# Patient Record
Sex: Female | Born: 1990 | Hispanic: Refuse to answer | Marital: Married | Smoking: Never smoker
Health system: Southern US, Community
[De-identification: ages and names within clinical notes are randomized; demographics above are authoritative.]

## PROBLEM LIST (undated history)

## (undated) DIAGNOSIS — Z3482 Encounter for supervision of other normal pregnancy, second trimester: Secondary | ICD-10-CM

## (undated) DIAGNOSIS — R509 Fever, unspecified: Secondary | ICD-10-CM

## (undated) DIAGNOSIS — Z789 Other specified health status: Secondary | ICD-10-CM

---

## 2015-06-07 NOTE — L&D Delivery Note (Signed)
Delivery Note At 1:41 AM a viable female was delivered via Vaginal, Spontaneous Delivery (Presentation:vtx; direct OA).  APGARs, weight  pending.  Infant delivered to skin to skin on mom. Infant felt warm. Spontaneous cry heard. Body cord noted. Delayed cord clamping x 1 minute. Placenta status: Spontaneous with 3-V Cord:  with the following complications: None.  Trailing membranes removed from uterus. Fundus firm, minimal bleeding  Anesthesia: Epidural, Local Episiotomy: None Lacerations:  2nd degree Suture Repair: 3.0 vicryl rapide Est. Blood Loss (mL):  400 mL  Mom to postpartum.  Baby to Couplet care / Skin to Skin.  Rainey Kahrs S 10/31/2015, 2:11 AM

## 2015-06-19 ENCOUNTER — Encounter: Payer: Self-pay | Admitting: *Deleted

## 2015-07-07 ENCOUNTER — Ambulatory Visit (INDEPENDENT_AMBULATORY_CARE_PROVIDER_SITE_OTHER): Payer: Medicaid Other | Admitting: Advanced Practice Midwife

## 2015-07-07 ENCOUNTER — Other Ambulatory Visit (HOSPITAL_COMMUNITY)
Admission: RE | Admit: 2015-07-07 | Discharge: 2015-07-07 | Disposition: A | Discharge status: Home / Self Care | Payer: Medicaid Other | Source: Ambulatory Visit | Attending: Advanced Practice Midwife | Admitting: Advanced Practice Midwife

## 2015-07-07 ENCOUNTER — Encounter: Payer: Self-pay | Admitting: Advanced Practice Midwife

## 2015-07-07 ENCOUNTER — Encounter: Payer: Self-pay | Admitting: Obstetrics & Gynecology

## 2015-07-07 VITALS — BP 135/76 | HR 98 | Temp 98.2°F | Ht 63.0 in | Wt 159.3 lb

## 2015-07-07 DIAGNOSIS — Z23 Encounter for immunization: Secondary | ICD-10-CM

## 2015-07-07 DIAGNOSIS — Z3402 Encounter for supervision of normal first pregnancy, second trimester: Secondary | ICD-10-CM

## 2015-07-07 DIAGNOSIS — Z3400 Encounter for supervision of normal first pregnancy, unspecified trimester: Secondary | ICD-10-CM | POA: Insufficient documentation

## 2015-07-07 DIAGNOSIS — Z01419 Encounter for gynecological examination (general) (routine) without abnormal findings: Secondary | ICD-10-CM | POA: Insufficient documentation

## 2015-07-07 DIAGNOSIS — Z113 Encounter for screening for infections with a predominantly sexual mode of transmission: Secondary | ICD-10-CM | POA: Insufficient documentation

## 2015-07-07 DIAGNOSIS — Z124 Encounter for screening for malignant neoplasm of cervix: Secondary | ICD-10-CM

## 2015-07-07 LAB — POCT URINALYSIS DIP (DEVICE)
BILIRUBIN URINE: NEGATIVE
GLUCOSE, UA: NEGATIVE mg/dL
KETONES UR: NEGATIVE mg/dL
Nitrite: NEGATIVE
PROTEIN: NEGATIVE mg/dL
SPECIFIC GRAVITY, URINE: 1.02 (ref 1.005–1.030)
Urobilinogen, UA: 0.2 mg/dL (ref 0.0–1.0)
pH: 6 (ref 5.0–8.0)

## 2015-07-07 NOTE — Progress Notes (Signed)
Anatomy US scheduled for February 6th @ 1500

## 2015-07-07 NOTE — Progress Notes (Signed)
Subjective:  Rebecca Ryan Rebecca Ryan is a 25 y.o. G1P0 at [redacted]w[redacted]d being seen today for ongoing prenatal care.  She is currently monitored for the following issues for this low-risk pregnancy and  does not have a problem list on file.  Patient reports no complaints.   . Vag. Bleeding: None.  Movement: Present. Denies leaking of fluid.   The following portions of the patient's history were reviewed and updated as appropriate: allergies, current medications, past family history, past medical history, past social history, past surgical history and problem list. Problem list updated.  Objective:   Filed Vitals:   07/07/15 1320 07/07/15 1321  BP: 135/76   Pulse: 98   Temp: 98.2 F (36.8 C)   Height:  5\' 3"  (1.6 m)  Weight: 159 lb 4.8 oz (72.258 kg)     Fetal Status: Fetal Heart Rate (bpm): 145   Movement: Present     General:  Alert, oriented and cooperative. Patient is in no acute distress.  Skin: Skin is warm and dry. No rash noted.   Cardiovascular: Normal heart rate noted  Respiratory: Normal respiratory effort, no problems with respiration noted  Abdomen: Soft, gravid, appropriate for gestational age. Pain/Pressure: Present     Pelvic: Vag. Bleeding: None     Cervical exam deferred        Extremities: Normal range of motion.  Edema: None  Mental Status: Normal mood and affect. Normal behavior. Normal judgment and thought content.   Urinalysis: Urine Protein: Negative Urine Glucose: Negative  Assessment and Plan:  Pregnancy: G1P0 at [redacted]w[redacted]d  1. Encounter for supervision of normal first pregnancy in second trimester  - Prenatal Profile - Prescript Monitor Profile(19) - Hemoglobinopathy evaluation - GC/Chlamydia probe amp ([place])not at [facility] - Cytology - PAP - Culture, OB Urine - [place] MFM OB COMP + 14 WK; Future  2. Needs flu shot   Preterm labor symptoms and general obstetric precautions including but not limited to vaginal bleeding, contractions, leaking of fluid and fetal  movement were reviewed in detail with the patient. Please refer to After Visit Summary for other counseling recommendations.  Return in about 4 weeks (around 08/04/2015).   Elvera Maria, CNM

## 2015-07-08 LAB — PRENATAL PROFILE (SOLSTAS)
Antibody Screen: NEGATIVE
BASOS ABS: 0 10*3/uL (ref 0.0–0.1)
Basophils Relative: 0 % (ref 0–1)
EOS PCT: 1 % (ref 0–5)
Eosinophils Absolute: 0.1 10*3/uL (ref 0.0–0.7)
HEMATOCRIT: 36.3 % (ref 36.0–46.0)
HEP B S AG: NEGATIVE
HIV 1&2 Ab, 4th Generation: NONREACTIVE
Hemoglobin: 12.6 g/dL (ref 12.0–15.0)
LYMPHS ABS: 1.5 10*3/uL (ref 0.7–4.0)
LYMPHS PCT: 11 % — AB (ref 12–46)
MCH: 32.6 pg (ref 26.0–34.0)
MCHC: 34.7 g/dL (ref 30.0–36.0)
MCV: 93.8 fL (ref 78.0–100.0)
MPV: 10.1 fL (ref 8.6–12.4)
Monocytes Absolute: 0.7 10*3/uL (ref 0.1–1.0)
Monocytes Relative: 5 % (ref 3–12)
NEUTROS ABS: 11.1 10*3/uL — AB (ref 1.7–7.7)
Neutrophils Relative %: 83 % — ABNORMAL HIGH (ref 43–77)
PLATELETS: 196 10*3/uL (ref 150–400)
RBC: 3.87 MIL/uL (ref 3.87–5.11)
RDW: 14.8 % (ref 11.5–15.5)
RH TYPE: POSITIVE
Rubella: 4.94 Index — ABNORMAL HIGH (ref <0.90)
WBC: 13.4 10*3/uL — AB (ref 4.0–10.5)

## 2015-07-08 LAB — HEMOGLOBINOPATHY EVALUATION
HGB A: 97.2 % (ref 96.8–97.8)
HGB S QUANTITAION: 0 %
Hemoglobin Other: 0 %
Hgb A2 Quant: 2.8 % (ref 2.2–3.2)
Hgb F Quant: 0 % (ref 0.0–2.0)

## 2015-07-08 LAB — PRESCRIPTION MONITORING PROFILE (19 PANEL)
Amphetamine/Meth: NEGATIVE ng/mL
Barbiturate Screen, Urine: NEGATIVE ng/mL
Benzodiazepine Screen, Urine: NEGATIVE ng/mL
Buprenorphine, Urine: NEGATIVE ng/mL
CANNABINOID SCRN UR: NEGATIVE ng/mL
CARISOPRODOL, URINE: NEGATIVE ng/mL
COCAINE METABOLITES: NEGATIVE ng/mL
CREATININE, URINE: 148.58 mg/dL (ref >20.0)
FENTANYL URINE: NEGATIVE ng/mL
MDMA URINE: NEGATIVE ng/mL
MEPERIDINE UR: NEGATIVE ng/mL
METHADONE SCREEN, URINE: NEGATIVE ng/mL
Methaqualone: NEGATIVE ng/mL
Nitrites, Initial: NEGATIVE ug/mL
OXYCODONE SCRN UR: NEGATIVE ng/mL
Opiate Screen, Urine: NEGATIVE ng/mL
PH URINE, INITIAL: 6.7 pH (ref 4.5–8.9)
PHENCYCLIDINE, UR: NEGATIVE ng/mL
Propoxyphene: NEGATIVE ng/mL
TRAMADOL UR: NEGATIVE ng/mL
Tapentadol, urine: NEGATIVE ng/mL
Zolpidem, Urine: NEGATIVE ng/mL

## 2015-07-08 LAB — CYTOLOGY - PAP

## 2015-07-08 LAB — GC/CHLAMYDIA PROBE AMP (~~LOC~~) NOT AT ARMC
CHLAMYDIA, DNA PROBE: NEGATIVE
Neisseria Gonorrhea: NEGATIVE

## 2015-07-09 LAB — CULTURE, OB URINE
COLONY COUNT: NO GROWTH
ORGANISM ID, BACTERIA: NO GROWTH

## 2015-07-13 ENCOUNTER — Ambulatory Visit (HOSPITAL_COMMUNITY)
Admission: RE | Admit: 2015-07-13 | Discharge: 2015-07-13 | Disposition: A | Discharge status: Home / Self Care | Payer: Medicaid Other | Source: Ambulatory Visit | Attending: Advanced Practice Midwife | Admitting: Advanced Practice Midwife

## 2015-07-13 DIAGNOSIS — Z36 Encounter for antenatal screening of mother: Secondary | ICD-10-CM | POA: Diagnosis not present

## 2015-07-13 DIAGNOSIS — Z3402 Encounter for supervision of normal first pregnancy, second trimester: Secondary | ICD-10-CM

## 2015-07-13 DIAGNOSIS — Z3A25 25 weeks gestation of pregnancy: Secondary | ICD-10-CM | POA: Diagnosis not present

## 2015-07-13 DIAGNOSIS — D259 Leiomyoma of uterus, unspecified: Secondary | ICD-10-CM | POA: Insufficient documentation

## 2015-07-13 DIAGNOSIS — O3412 Maternal care for benign tumor of corpus uteri, second trimester: Secondary | ICD-10-CM | POA: Insufficient documentation

## 2015-07-16 ENCOUNTER — Telehealth: Payer: Self-pay | Admitting: *Deleted

## 2015-07-16 DIAGNOSIS — Z3402 Encounter for supervision of normal first pregnancy, second trimester: Secondary | ICD-10-CM

## 2015-07-16 MED ORDER — PRENATAL VITAMINS 0.8 MG PO TABS: 1.0000 | ORAL_TABLET | Freq: Every day | ORAL | Status: AC

## 2015-07-16 NOTE — Telephone Encounter (Signed)
Patient's husband called asking for test results from 1/31 for his wife. Returned call using Arabic interpreter [phone]. Spoke with Patient and husband and gave them normal test results. Patient's husband requests prescription for prenatal vitamin that is pork product free. I told him this would be sent to their pharmacy today. Understanding voiced. [place], spoke to pharmacist, who stated that and "gummy" or "capsule" is considered to have pork, but a tablet does not. Prescription sent for prenatal tablet.

## 2015-08-04 ENCOUNTER — Ambulatory Visit (INDEPENDENT_AMBULATORY_CARE_PROVIDER_SITE_OTHER): Payer: Medicaid Other | Admitting: Family

## 2015-08-04 VITALS — BP 124/74 | HR 94 | Wt 166.6 lb

## 2015-08-04 DIAGNOSIS — Z3402 Encounter for supervision of normal first pregnancy, second trimester: Secondary | ICD-10-CM

## 2015-08-04 NOTE — Progress Notes (Signed)
Subjective:  Rebecca Ryan Rebecca Ryan is a 25 y.o. G1P0 at [redacted]w[redacted]d being seen today for ongoing prenatal care.  She is currently monitored for the following issues for this low-risk pregnancy and has Supervision of normal first pregnancy on her problem list.  Patient reports no complaints.   . Vag. Bleeding: None.  Movement: Present. Denies leaking of fluid.   The following portions of the patient's history were reviewed and updated as appropriate: allergies, current medications, past family history, past medical history, past social history, past surgical history and problem list. Problem list updated.  Objective:   Filed Vitals:   08/04/15 1106  BP: 124/74  Pulse: 94  Weight: 166 lb 9.6 oz (75.569 kg)    Fetal Status: Fetal Heart Rate (bpm): 154 Fundal Height: 28 cm Movement: Present     General:  Alert, oriented and cooperative. Patient is in no acute distress.  Skin: Skin is warm and dry. No rash noted.   Cardiovascular: Normal heart rate noted  Respiratory: Normal respiratory effort, no problems with respiration noted  Abdomen: Soft, gravid, appropriate for gestational age. Pain/Pressure: Present     Pelvic: Vag. Bleeding: None     Cervical exam deferred        Extremities: Normal range of motion.  Edema: None  Mental Status: Normal mood and affect. Normal behavior. Normal judgment and thought content.   Urinalysis:     Urine results not available at discharge.     Assessment and Plan:  Pregnancy: G1P0 at [redacted]w[redacted]d  1. Encounter for supervision of normal first pregnancy in second trimester - Glucose Tolerance, 1 HR (50g) w/o Fasting - [place] MFM OB FOLLOW UP; Future for poor view of spine per husband's request  Preterm labor symptoms and general obstetric precautions including but not limited to vaginal bleeding, contractions, leaking of fluid and fetal movement were reviewed in detail with the patient. Please refer to After Visit Summary for other counseling recommendations.  Return  in about 2 weeks (around 08/18/2015).   Venia Carbon Michiel Cowboy, CNM

## 2015-08-05 LAB — GLUCOSE TOLERANCE, 1 HOUR (50G) W/O FASTING: GLUCOSE, 1 HR, GESTATIONAL: 75 mg/dL (ref <140)

## 2015-08-07 ENCOUNTER — Other Ambulatory Visit: Payer: Self-pay | Admitting: Family

## 2015-08-07 ENCOUNTER — Ambulatory Visit (HOSPITAL_COMMUNITY)
Admission: RE | Admit: 2015-08-07 | Discharge: 2015-08-07 | Disposition: A | Discharge status: Home / Self Care | Payer: Medicaid Other | Source: Ambulatory Visit | Attending: Family | Admitting: Family

## 2015-08-07 DIAGNOSIS — Z3A29 29 weeks gestation of pregnancy: Secondary | ICD-10-CM

## 2015-08-07 DIAGNOSIS — Z0489 Encounter for examination and observation for other specified reasons: Secondary | ICD-10-CM

## 2015-08-07 DIAGNOSIS — O3413 Maternal care for benign tumor of corpus uteri, third trimester: Secondary | ICD-10-CM | POA: Diagnosis not present

## 2015-08-07 DIAGNOSIS — Z36 Encounter for antenatal screening of mother: Secondary | ICD-10-CM | POA: Diagnosis present

## 2015-08-07 DIAGNOSIS — D259 Leiomyoma of uterus, unspecified: Secondary | ICD-10-CM | POA: Insufficient documentation

## 2015-08-07 DIAGNOSIS — IMO0002 Reserved for concepts with insufficient information to code with codable children: Secondary | ICD-10-CM

## 2015-08-07 DIAGNOSIS — Z3402 Encounter for supervision of normal first pregnancy, second trimester: Secondary | ICD-10-CM

## 2015-08-07 DIAGNOSIS — O3412 Maternal care for benign tumor of corpus uteri, second trimester: Principal | ICD-10-CM

## 2015-08-18 ENCOUNTER — Ambulatory Visit (INDEPENDENT_AMBULATORY_CARE_PROVIDER_SITE_OTHER): Payer: Medicaid Other | Admitting: Family

## 2015-08-18 VITALS — BP 121/74 | HR 88 | Temp 98.5°F | Wt 168.1 lb

## 2015-08-18 DIAGNOSIS — Z3402 Encounter for supervision of normal first pregnancy, second trimester: Secondary | ICD-10-CM | POA: Diagnosis present

## 2015-08-18 DIAGNOSIS — Z23 Encounter for immunization: Secondary | ICD-10-CM | POA: Diagnosis not present

## 2015-08-18 DIAGNOSIS — R319 Hematuria, unspecified: Secondary | ICD-10-CM | POA: Diagnosis not present

## 2015-08-18 DIAGNOSIS — Z3493 Encounter for supervision of normal pregnancy, unspecified, third trimester: Secondary | ICD-10-CM

## 2015-08-18 LAB — POCT URINALYSIS DIP (DEVICE)
BILIRUBIN URINE: NEGATIVE
GLUCOSE, UA: NEGATIVE mg/dL
Ketones, ur: NEGATIVE mg/dL
NITRITE: NEGATIVE
PH: 6 (ref 5.0–8.0)
Protein, ur: NEGATIVE mg/dL
Specific Gravity, Urine: 1.02 (ref 1.005–1.030)
Urobilinogen, UA: 0.2 mg/dL (ref 0.0–1.0)

## 2015-08-18 MED ORDER — TETANUS-DIPHTH-ACELL PERTUSSIS 5-2.5-18.5 LF-MCG/0.5 IM SUSP
0.5000 mL | Freq: Once | INTRAMUSCULAR | Status: AC
  Administered 2015-08-18: 0.5 mL via INTRAMUSCULAR

## 2015-08-18 NOTE — Progress Notes (Signed)
Used Interpreter [employer]. Large leukocytes and moderate hemoglobin noted on urinalysis. Denies pain or burning with urination. C/o pain /pressure under ribs on her right side.

## 2015-08-18 NOTE — Progress Notes (Signed)
Subjective:  Rebecca Ryan is a 25 y.o. G1P0 at [redacted]w[redacted]d being seen today for ongoing prenatal care.  She is currently monitored for the following issues for this low-risk pregnancy and has Supervision of normal first pregnancy on her problem list.  Patient reports no complaints.  Denies UTI symptoms.  Contractions: Not present. Vag. Bleeding: None.  Movement: Present. Denies leaking of fluid.   The following portions of the patient's history were reviewed and updated as appropriate: allergies, current medications, past family history, past medical history, past social history, past surgical history and problem list. Problem list updated.  Objective:   Filed Vitals:   08/18/15 0944  BP: 121/74  Pulse: 88  Temp: 98.5 F (36.9 C)  Weight: 168 lb 1.6 oz (76.25 kg)    Fetal Status: Fetal Heart Rate (bpm): 127 Fundal Height: 31 cm Movement: Present     General:  Alert, oriented and cooperative. Patient is in no acute distress.  Skin: Skin is warm and dry. No rash noted.   Cardiovascular: Normal heart rate noted  Respiratory: Normal respiratory effort, no problems with respiration noted  Abdomen: Soft, gravid, appropriate for gestational age. Pain/Pressure: Present     Pelvic: Vag. Bleeding: None Vag D/C Character: White   Cervical exam deferred        Extremities: Normal range of motion.  Edema: None  Mental Status: Normal mood and affect. Normal behavior. Normal judgment and thought content.   Urinalysis: Urine Protein: Negative Urine Glucose: Negative  Assessment and Plan:  Pregnancy: G1P0 at [redacted]w[redacted]d  1. Encounter for supervision of normal first pregnancy in second trimester - Reviewed ultrasound results (spine normal)  2. Supervision of normal pregnancy, third trimester - Tdap (BOOSTRIX) injection 0.5 mL; Inject 0.5 mLs into the muscle once.  3. Hematuria - Culture, OB Urine   Preterm labor symptoms and general obstetric precautions including but not limited to vaginal  bleeding, contractions, leaking of fluid and fetal movement were reviewed in detail with the patient. Please refer to After Visit Summary for other counseling recommendations.  Return in about 2 weeks (around 09/01/2015).   Venia Carbon Michiel Cowboy, CNM

## 2015-08-20 LAB — CULTURE, OB URINE: Colony Count: >=100000

## 2015-09-01 ENCOUNTER — Ambulatory Visit (INDEPENDENT_AMBULATORY_CARE_PROVIDER_SITE_OTHER): Payer: Medicaid Other | Admitting: Family Medicine

## 2015-09-01 VITALS — BP 106/59 | HR 82 | Temp 97.7°F | Wt 169.5 lb

## 2015-09-01 DIAGNOSIS — Z3403 Encounter for supervision of normal first pregnancy, third trimester: Secondary | ICD-10-CM

## 2015-09-01 LAB — POCT URINALYSIS DIP (DEVICE)
Bilirubin Urine: NEGATIVE
GLUCOSE, UA: NEGATIVE mg/dL
Ketones, ur: NEGATIVE mg/dL
NITRITE: NEGATIVE
Protein, ur: NEGATIVE mg/dL
Specific Gravity, Urine: 1.015 (ref 1.005–1.030)
UROBILINOGEN UA: 0.2 mg/dL (ref 0.0–1.0)
pH: 6.5 (ref 5.0–8.0)

## 2015-09-01 NOTE — Patient Instructions (Signed)
Come to the MAU (maternity admission unit) for 1) Strong contractions every 2-3 minutes for at least 1 hour that do not go away when you drink water or take a warm shower. These contractions will be so strong all you can do is breath through them 2) Vaginal bleeding- anything more than spotting 3) Loss of fluid like you broke your water 4) Decreased movement of your baby  Pain Relief During Labor and Delivery Everyone experiences pain differently, but labor causes severe pain for many women. The amount of pain you experience during labor and delivery depends on your pain tolerance, contraction strength, and your baby's size and position. There are many ways to prepare for and deal with the pain, including:   Taking prenatal classes to learn about labor and delivery. The more informed you are, the less anxious and afraid you may be. This can help lessen the pain.  Taking pain-relieving medicine during labor and delivery.  Learning breathing and relaxation techniques.  Taking a shower or bath.  Getting massaged.  Changing positions.  Placing an ice pack on your back. Discuss your pain control options with your health care provider during your prenatal visits.  WHAT ARE THE TWO TYPES OF PAIN-RELIEVING MEDICINES? 1. Analgesics. These are medicines that decrease pain without total loss of feeling or muscle movement. 2. Anesthetics. These are medicines that block all feeling, including pain. There can be minor side effects of both types, such as nausea, trouble concentrating, becoming sleepy, and lowering the heart rate of the baby. However, health care providers are careful to give doses that will not seriously affect the baby.  WHAT ARE THE SPECIFIC TYPES OF ANALGESICS AND ANESTHETICS? Systemic Analgesic Systemic pain medicines affect your whole body rather than focusing pain relief on the area of your body experiencing pain. This type of medicine is given either through an IV tube in your  vein or by a shot (injection) into your muscle. This medicine will lessen your pain but will not stop it completely. It may also make you sleepy, but it will not make you lose consciousness.  Local Anesthetic Local anesthetic isused tonumb a small area of your body. The medicine is injected into the area of nerves that carry feeling to the vagina, vulva, or the area between the vagina and anus (perineum).  General Anesthetic This type of medicine causes you to lose consciousness so you do not feel pain. It is usually used only in emergency situations during labor. It is given through an IV tube or face mask. Paracervical Block A paracervical block is a form of local anesthesia given during labor. Numbing medicine is injected into the right and left sides of the cervix and vagina. It helps to lessen the pain caused by contractions and stretching of the cervix. It may have to be given more than once.  Pudendal Block A pudendal block is another form of local anesthesia. It is used to relieve the pain associated with pushing or stretching of the perineum at the time of delivery. An injection is given deep through the vaginal wall into the pudendal nerve in the pelvis, numbing the perineum.  Epidural Anesthetic An epidural is an injection of numbing medicine given in the lower back and into the epidural space near your spinal cord. The epidural numbs the lower half of your body. You may be able to move your legs but will not be allowed to walk. Epidurals can be used for labor, delivery, or cesarean deliveries.  To prevent  the medicine from wearing off, a small tube (catheter) may be threaded into the epidural space and taped in place to prevent it from slipping out. Medicine can then be given continuously in small doses through the tube until you deliver. Spinal Block A spinal block is similar to an epidural, but the medicine is injected into the spinal fluid, not the epidural space. A spinal block is  only given once. It starts to relieve pain quickly but lasts only 1-2 hours. Spinal blocks can also be used for cesarean deliveries.  Combined Spinal-Epidural Block Combined spinal-epidural blocks combine the benefits of both the spinal and epidural blocks. The spinal part acts quickly to relieve pain and the epidural provides continuous pain relief. Hydrotherapy Immersion in warm water during labor may provide comfort and relaxation. It may also help to lessen pain, the use of anesthesia, and the length of labor. However, immersion in water during the delivery (water birth) may have some risk involved and studies to determine safety and risks are ongoing. If you are a healthy woman who is expecting an uncomplicated birth, talk with your health care provider to see if water birth is an option for you.    This information is not intended to replace advice given to you by your health care provider. Make sure you discuss any questions you have with your health care provider.   Document Released: 09/08/2008 Document Revised: 05/28/2013 Document Reviewed: 10/11/2012 Elsevier Interactive Patient Education [employer].

## 2015-09-01 NOTE — Progress Notes (Signed)
Interpreter Florestine Avers present for encounter.  Pt reports frequent heartburn - takes Tums w/good relief. Breastfeeding tip of the week reviewed.

## 2015-09-01 NOTE — Progress Notes (Signed)
Subjective:  Rebecca Ryan is a 25 y.o. G1P0 at [redacted]w[redacted]d being seen today for ongoing prenatal care.  She is currently monitored for the following issues for this low-risk pregnancy and has Supervision of normal first pregnancy on her problem list.  Patient reports heartburn and nausea.  Contractions: Not present. Vag. Bleeding: None.  Movement: Present. Denies leaking of fluid.   The following portions of the patient's history were reviewed and updated as appropriate: allergies, current medications, past family history, past medical history, past social history, past surgical history and problem list. Problem list updated.  Objective:   Filed Vitals:   09/01/15 1033  BP: 106/59  Pulse: 82  Temp: 97.7 F (36.5 C)  Weight: 169 lb 8 oz (76.885 kg)    Fetal Status: Fetal Heart Rate (bpm): 143   Movement: Present     General:  Alert, oriented and cooperative. Patient is in no acute distress.  Skin: Skin is warm and dry. No rash noted.   Cardiovascular: Normal heart rate noted  Respiratory: Normal respiratory effort, no problems with respiration noted  Abdomen: Soft, gravid, appropriate for gestational age. Pain/Pressure: Present     Pelvic: Vag. Bleeding: None     Cervical exam deferred        Extremities: Normal range of motion.  Edema: None  Mental Status: Normal mood and affect. Normal behavior. Normal judgment and thought content.   Urinalysis:      Assessment and Plan:  Pregnancy: G1P0 at [redacted]w[redacted]d  1. Encounter for supervision of normal first pregnancy in third trimester Updated box Discussed use of zantac for GERD Reviewed weight gain   Preterm labor symptoms and general obstetric precautions including but not limited to vaginal bleeding, contractions, leaking of fluid and fetal movement were reviewed in detail with the patient. Please refer to After Visit Summary for other counseling recommendations.  Return in about 2 weeks (around 09/15/2015) for Routine prenatal  care.   Caren Macadam, MD

## 2015-09-15 ENCOUNTER — Encounter: Payer: Medicaid Other | Admitting: Advanced Practice Midwife

## 2015-09-17 ENCOUNTER — Ambulatory Visit (INDEPENDENT_AMBULATORY_CARE_PROVIDER_SITE_OTHER): Payer: Medicaid Other | Admitting: Family Medicine

## 2015-09-17 ENCOUNTER — Encounter: Payer: Self-pay | Admitting: Family Medicine

## 2015-09-17 VITALS — BP 128/67 | HR 84 | Wt 171.4 lb

## 2015-09-17 DIAGNOSIS — Z3403 Encounter for supervision of normal first pregnancy, third trimester: Secondary | ICD-10-CM

## 2015-09-17 LAB — POCT URINALYSIS DIP (DEVICE)
BILIRUBIN URINE: NEGATIVE
GLUCOSE, UA: NEGATIVE mg/dL
KETONES UR: NEGATIVE mg/dL
NITRITE: NEGATIVE
PH: 7 (ref 5.0–8.0)
Protein, ur: NEGATIVE mg/dL
Specific Gravity, Urine: 1.02 (ref 1.005–1.030)
Urobilinogen, UA: 0.2 mg/dL (ref 0.0–1.0)

## 2015-09-17 NOTE — Patient Instructions (Signed)

## 2015-09-17 NOTE — Progress Notes (Signed)
Subjective:  Rebecca Ryan is a 25 y.o. G1P0 at [redacted]w[redacted]d being seen today for ongoing prenatal care.  She is currently monitored for the following issues for this low-risk pregnancy and has Supervision of normal first pregnancy on her problem list.  Patient reports backache.  Contractions: Not present. Vag. Bleeding: None.  Movement: Present. Denies leaking of fluid.   The following portions of the patient's history were reviewed and updated as appropriate: allergies, current medications, past family history, past medical history, past social history, past surgical history and problem list. Problem list updated.  Objective:   Filed Vitals:   09/17/15 1104  BP: 128/67  Pulse: 84  Weight: 171 lb 6.4 oz (77.747 kg)    Fetal Status: Fetal Heart Rate (bpm): 152   Movement: Present     General:  Alert, oriented and cooperative. Patient is in no acute distress.  Skin: Skin is warm and dry. No rash noted.   Cardiovascular: Normal heart rate noted  Respiratory: Normal respiratory effort, no problems with respiration noted  Abdomen: Soft, gravid, appropriate for gestational age. Pain/Pressure: Present     Pelvic: Vag. Bleeding: None     Cervical exam deferred        Extremities: Normal range of motion.  Edema: None  Mental Status: Normal mood and affect. Normal behavior. Normal judgment and thought content.   Urinalysis:      Assessment and Plan:  Pregnancy: G1P0 at [redacted]w[redacted]d  1. Encounter for supervision of normal first pregnancy in third trimester FHT and FH normal.  GC/CT, GBS at next appt.     Preterm labor symptoms and general obstetric precautions including but not limited to vaginal bleeding, contractions, leaking of fluid and fetal movement were reviewed in detail with the patient. Please refer to After Visit Summary for other counseling recommendations.  Return in about 2 weeks (around 10/01/2015) for LR OB f/u.   Truett Mainland, DO

## 2015-09-29 ENCOUNTER — Other Ambulatory Visit (HOSPITAL_COMMUNITY)
Admission: RE | Admit: 2015-09-29 | Discharge: 2015-09-29 | Disposition: A | Discharge status: Home / Self Care | Payer: Medicaid Other | Source: Ambulatory Visit | Attending: Obstetrics and Gynecology | Admitting: Obstetrics and Gynecology

## 2015-09-29 ENCOUNTER — Ambulatory Visit (INDEPENDENT_AMBULATORY_CARE_PROVIDER_SITE_OTHER): Payer: Medicaid Other | Admitting: Obstetrics and Gynecology

## 2015-09-29 VITALS — BP 115/72 | HR 80 | Wt 175.8 lb

## 2015-09-29 DIAGNOSIS — Z113 Encounter for screening for infections with a predominantly sexual mode of transmission: Secondary | ICD-10-CM | POA: Diagnosis present

## 2015-09-29 DIAGNOSIS — Z3403 Encounter for supervision of normal first pregnancy, third trimester: Secondary | ICD-10-CM

## 2015-09-29 LAB — POCT URINALYSIS DIP (DEVICE)
BILIRUBIN URINE: NEGATIVE
Glucose, UA: NEGATIVE mg/dL
HGB URINE DIPSTICK: NEGATIVE
KETONES UR: NEGATIVE mg/dL
NITRITE: NEGATIVE
PH: 6.5 (ref 5.0–8.0)
Protein, ur: NEGATIVE mg/dL
SPECIFIC GRAVITY, URINE: 1.015 (ref 1.005–1.030)
Urobilinogen, UA: 0.2 mg/dL (ref 0.0–1.0)

## 2015-09-29 LAB — OB RESULTS CONSOLE GBS: STREP GROUP B AG: NEGATIVE

## 2015-09-29 LAB — OB RESULTS CONSOLE GC/CHLAMYDIA: GC PROBE AMP, GENITAL: NEGATIVE

## 2015-09-29 NOTE — Patient Instructions (Signed)

## 2015-09-29 NOTE — Progress Notes (Signed)
Subjective:  Rebecca Ryan is a 25 y.o. G1P0 at [redacted]w[redacted]d being seen today for ongoing prenatal care.  She is currently monitored for the following issues for this low-risk pregnancy and has Supervision of normal first pregnancy on her problem list.  Patient reports no complaints.  Contractions: Not present. Vag. Bleeding: None.  Movement: Present. Denies leaking of fluid.   The following portions of the patient's history were reviewed and updated as appropriate: allergies, current medications, past family history, past medical history, past social history, past surgical history and problem list. Problem list updated.  Objective:   Filed Vitals:   09/29/15 1032  BP: 115/72  Pulse: 80  Weight: 175 lb 12.8 oz (79.742 kg)    Fetal Status: Fetal Heart Rate (bpm): 140   Movement: Present     General:  Alert, oriented and cooperative. Patient is in no acute distress.  Skin: Skin is warm and dry. No rash noted.   Cardiovascular: Normal heart rate noted  Respiratory: Normal respiratory effort, no problems with respiration noted  Abdomen: Soft, gravid, appropriate for gestational age. Pain/Pressure: Present     Pelvic: Vag. Bleeding: None     Cervical exam performed      L/C/-s cephalic  Extremities: Normal range of motion.  Edema: Trace  Mental Status: Normal mood and affect. Normal behavior. Normal judgment and thought content.   Urinalysis:    N/N  Assessment and Plan:  Pregnancy: G1P0 at [redacted]w[redacted]d  1. Encounter for supervision of normal first pregnancy in third trimester Doing well - Culture, beta strep (group b only) - GC/Chlamydia probe amp ([place])not at [facility]  Term labor symptoms and general obstetric precautions including but not limited to vaginal bleeding, contractions, leaking of fluid and fetal movement were reviewed in detail with the patient. Please refer to After Visit Summary for other counseling recommendations.  Return in about 1 week (around  10/06/2015).   Lorene Dy, CNM

## 2015-09-29 NOTE — Progress Notes (Signed)
Cultures today 

## 2015-09-30 LAB — GC/CHLAMYDIA PROBE AMP (~~LOC~~) NOT AT ARMC
CHLAMYDIA, DNA PROBE: NEGATIVE
Neisseria Gonorrhea: NEGATIVE

## 2015-10-01 LAB — CULTURE, BETA STREP (GROUP B ONLY)

## 2015-10-06 ENCOUNTER — Ambulatory Visit (INDEPENDENT_AMBULATORY_CARE_PROVIDER_SITE_OTHER): Payer: Medicaid Other | Admitting: Advanced Practice Midwife

## 2015-10-06 ENCOUNTER — Encounter: Payer: Self-pay | Admitting: Advanced Practice Midwife

## 2015-10-06 VITALS — BP 119/68 | HR 79 | Wt 175.0 lb

## 2015-10-06 DIAGNOSIS — Z3403 Encounter for supervision of normal first pregnancy, third trimester: Secondary | ICD-10-CM

## 2015-10-06 DIAGNOSIS — Z603 Acculturation difficulty: Secondary | ICD-10-CM

## 2015-10-06 NOTE — Patient Instructions (Signed)
Vaginal Delivery °During delivery, your health care provider will help you give birth to your baby. During a vaginal delivery, you will work to push the baby out of your vagina. However, before you can push your baby out, a few things need to happen. The opening of your uterus (cervix) has to soften, thin out, and open up (dilate) all the way to 10 cm. Also, your baby has to move down from the uterus into your vagina.  °SIGNS OF LABOR  °Your health care provider will first need to make sure you are in labor. Signs of labor include:  °· Passing what is called the mucous plug before labor begins. This is a small amount of blood-stained mucus. °· Having regular, painful uterine contractions.   °· The time between contractions gets shorter.   °· The discomfort and pain gradually get more intense. °· Contraction pains get worse when walking and do not go away when resting.   °· Your cervix becomes thinner (effacement) and dilates. °BEFORE THE DELIVERY °Once you are in labor and admitted into the hospital or care center, your health care provider may do the following:  °· Perform a complete physical exam. °· Review any complications related to pregnancy or labor.  °· Check your blood pressure, pulse, temperature, and heart rate (vital signs).   °· Determine if, and when, the rupture of amniotic membranes occurred. °· Do a vaginal exam (using a sterile glove and lubricant) to determine:   °¨ The position (presentation) of the baby. Is the baby's head presenting first (vertex) in the birth canal (vagina), or are the feet or buttocks first (breech)?   °¨ The level (station) of the baby's head within the birth canal.   °¨ The effacement and dilatation of the cervix.   °· An electronic fetal monitor is usually placed on your abdomen when you first arrive. This is used to monitor your contractions and the baby's heart rate. °¨ When the monitor is on your abdomen (external fetal monitor), it can only pick up the frequency and  length of your contractions. It cannot tell the strength of your contractions. °¨ If it becomes necessary for your health care provider to know exactly how strong your contractions are or to see exactly what the baby's heart rate is doing, an internal monitor may be inserted into your vagina and uterus. Your health care provider will discuss the benefits and risks of using an internal monitor and obtain your permission before inserting the device. °¨ Continuous fetal monitoring may be needed if you have an epidural, are receiving certain medicines (such as oxytocin), or have pregnancy or labor complications. °· An IV access tube may be placed into a vein in your arm to deliver fluids and medicines if necessary. °THREE STAGES OF LABOR AND DELIVERY °Normal labor and delivery is divided into three stages. °First Stage °This stage starts when you begin to contract regularly and your cervix begins to efface and dilate. It ends when your cervix is completely open (fully dilated). The first stage is the longest stage of labor and can last from 3 hours to 15 hours.  °Several methods are available to help with labor pain. You and your health care provider will decide which option is best for you. Options include:  °· Opioid medicines. These are strong pain medicines that you can get through your IV tube or as a shot into your muscle. These medicines lessen pain but do not make it go away completely.  °· Epidural. A medicine is given through a thin tube that   is inserted in your back. The medicine numbs the lower part of your body and prevents any pain in that area. °· Paracervical pain medicine. This is an injection of an anesthetic on each side of your cervix.   °· You may request natural childbirth, which does not involve the use of pain medicines or an epidural during labor and delivery. Instead, you will use other things, such as breathing exercises, to help cope with the pain. °Second Stage °The second stage of labor  begins when your cervix is fully dilated at 10 cm. It continues until you push your baby down through the birth canal and the baby is born. This stage can take only minutes or several hours. °· The location of your baby's head as it moves through the birth canal is reported as a number called a station. If the baby's head has not started its descent, the station is described as being at minus 3 (-3). When your baby's head is at the zero station, it is at the middle of the birth canal and is engaged in the pelvis. The station of your baby helps indicate the progress of the second stage of labor. °· When your baby is born, your health care provider may hold the baby with his or her head lowered to prevent amniotic fluid, mucus, and blood from getting into the baby's lungs. The baby's mouth and nose may be suctioned with a small bulb syringe to remove any additional fluid. °· Your health care provider may then place the baby on your stomach. It is important to keep the baby from getting cold. To do this, the health care provider will dry the baby off, place the baby directly on your skin (with no blankets between you and the baby), and cover the baby with warm, dry blankets.   °· The umbilical cord is cut. °Third Stage °During the third stage of labor, your health care provider will deliver the placenta (afterbirth) and make sure your bleeding is under control. The delivery of the placenta usually takes about 5 minutes but can take up to 30 minutes. After the placenta is delivered, a medicine may be given either by IV or injection to help contract the uterus and control bleeding. If you are planning to breastfeed, you can try to do so now. °After you deliver the placenta, your uterus should contract and get very firm. If your uterus does not remain firm, your health care provider will massage it. This is important because the contraction of the uterus helps cut off bleeding at the site where the placenta was attached  to your uterus. If your uterus does not contract properly and stay firm, you may continue to bleed heavily. If there is a lot of bleeding, medicines may be given to contract the uterus and stop the bleeding.  °  °This information is not intended to replace advice given to you by your health care provider. Make sure you discuss any questions you have with your health care provider. °  °Document Released: 03/01/2008 Document Revised: 06/13/2014 Document Reviewed: 01/18/2012 °Elsevier Interactive Patient Education ©2016 [employer] ° °

## 2015-10-06 NOTE — Progress Notes (Signed)
Video Interpreter [phone]

## 2015-10-06 NOTE — Progress Notes (Signed)
Subjective:  Rebecca Ryan is a 25 y.o. G1P0 at [redacted]w[redacted]d being seen today for ongoing prenatal care.  She is currently monitored for the following issues for this low-risk pregnancy and has Supervision of normal first pregnancy and Language barrier, cultural differences on her problem list.  Patient reports no complaints.  Contractions: Irritability. Vag. Bleeding: None.  Movement: Present. Denies leaking of fluid.   The following portions of the patient's history were reviewed and updated as appropriate: allergies, current medications, past family history, past medical history, past social history, past surgical history and problem list. Problem list updated.  Objective:   Filed Vitals:   10/06/15 0948  BP: 119/68  Pulse: 79  Weight: 175 lb (79.379 kg)    Fetal Status: Fetal Heart Rate (bpm): 140   Movement: Present     General:  Alert, oriented and cooperative. Patient is in no acute distress.  Skin: Skin is warm and dry. No rash noted.   Cardiovascular: Normal heart rate noted  Respiratory: Normal respiratory effort, no problems with respiration noted  Abdomen: Soft, gravid, appropriate for gestational age. Pain/Pressure: Present     Pelvic: Vag. Bleeding: None Vag D/C Character: White   Cervical exam deferred        Extremities: Normal range of motion.  Edema: Trace  Mental Status: Normal mood and affect. Normal behavior. Normal judgment and thought content.   Urinalysis:      Assessment and Plan:  Pregnancy: G1P0 at [redacted]w[redacted]d  1. Language barrier, cultural differences   Term labor symptoms and general obstetric precautions including but not limited to vaginal bleeding, contractions, leaking of fluid and fetal movement were reviewed in detail with the patient. Please refer to After Visit Summary for other counseling recommendations.  RTC 1 week  Seabron Spates, CNM

## 2015-10-13 ENCOUNTER — Ambulatory Visit (INDEPENDENT_AMBULATORY_CARE_PROVIDER_SITE_OTHER): Payer: Medicaid Other | Admitting: Advanced Practice Midwife

## 2015-10-13 VITALS — BP 118/70 | HR 95 | Wt 179.7 lb

## 2015-10-13 DIAGNOSIS — Z3403 Encounter for supervision of normal first pregnancy, third trimester: Secondary | ICD-10-CM

## 2015-10-13 LAB — POCT URINALYSIS DIP (DEVICE)
BILIRUBIN URINE: NEGATIVE
GLUCOSE, UA: NEGATIVE mg/dL
Hgb urine dipstick: NEGATIVE
Ketones, ur: NEGATIVE mg/dL
NITRITE: NEGATIVE
Protein, ur: NEGATIVE mg/dL
Specific Gravity, Urine: 1.015 (ref 1.005–1.030)
Urobilinogen, UA: 0.2 mg/dL (ref 0.0–1.0)
pH: 6.5 (ref 5.0–8.0)

## 2015-10-13 NOTE — Patient Instructions (Signed)
Vaginal Delivery °During delivery, your health care provider will help you give birth to your baby. During a vaginal delivery, you will work to push the baby out of your vagina. However, before you can push your baby out, a few things need to happen. The opening of your uterus (cervix) has to soften, thin out, and open up (dilate) all the way to 10 cm. Also, your baby has to move down from the uterus into your vagina.  °SIGNS OF LABOR  °Your health care provider will first need to make sure you are in labor. Signs of labor include:  °· Passing what is called the mucous plug before labor begins. This is a small amount of blood-stained mucus. °· Having regular, painful uterine contractions.   °· The time between contractions gets shorter.   °· The discomfort and pain gradually get more intense. °· Contraction pains get worse when walking and do not go away when resting.   °· Your cervix becomes thinner (effacement) and dilates. °BEFORE THE DELIVERY °Once you are in labor and admitted into the hospital or care center, your health care provider may do the following:  °· Perform a complete physical exam. °· Review any complications related to pregnancy or labor.  °· Check your blood pressure, pulse, temperature, and heart rate (vital signs).   °· Determine if, and when, the rupture of amniotic membranes occurred. °· Do a vaginal exam (using a sterile glove and lubricant) to determine:   °¨ The position (presentation) of the baby. Is the baby's head presenting first (vertex) in the birth canal (vagina), or are the feet or buttocks first (breech)?   °¨ The level (station) of the baby's head within the birth canal.   °¨ The effacement and dilatation of the cervix.   °· An electronic fetal monitor is usually placed on your abdomen when you first arrive. This is used to monitor your contractions and the baby's heart rate. °¨ When the monitor is on your abdomen (external fetal monitor), it can only pick up the frequency and  length of your contractions. It cannot tell the strength of your contractions. °¨ If it becomes necessary for your health care provider to know exactly how strong your contractions are or to see exactly what the baby's heart rate is doing, an internal monitor may be inserted into your vagina and uterus. Your health care provider will discuss the benefits and risks of using an internal monitor and obtain your permission before inserting the device. °¨ Continuous fetal monitoring may be needed if you have an epidural, are receiving certain medicines (such as oxytocin), or have pregnancy or labor complications. °· An IV access tube may be placed into a vein in your arm to deliver fluids and medicines if necessary. °THREE STAGES OF LABOR AND DELIVERY °Normal labor and delivery is divided into three stages. °First Stage °This stage starts when you begin to contract regularly and your cervix begins to efface and dilate. It ends when your cervix is completely open (fully dilated). The first stage is the longest stage of labor and can last from 3 hours to 15 hours.  °Several methods are available to help with labor pain. You and your health care provider will decide which option is best for you. Options include:  °· Opioid medicines. These are strong pain medicines that you can get through your IV tube or as a shot into your muscle. These medicines lessen pain but do not make it go away completely.  °· Epidural. A medicine is given through a thin tube that   is inserted in your back. The medicine numbs the lower part of your body and prevents any pain in that area. °· Paracervical pain medicine. This is an injection of an anesthetic on each side of your cervix.   °· You may request natural childbirth, which does not involve the use of pain medicines or an epidural during labor and delivery. Instead, you will use other things, such as breathing exercises, to help cope with the pain. °Second Stage °The second stage of labor  begins when your cervix is fully dilated at 10 cm. It continues until you push your baby down through the birth canal and the baby is born. This stage can take only minutes or several hours. °· The location of your baby's head as it moves through the birth canal is reported as a number called a station. If the baby's head has not started its descent, the station is described as being at minus 3 (-3). When your baby's head is at the zero station, it is at the middle of the birth canal and is engaged in the pelvis. The station of your baby helps indicate the progress of the second stage of labor. °· When your baby is born, your health care provider may hold the baby with his or her head lowered to prevent amniotic fluid, mucus, and blood from getting into the baby's lungs. The baby's mouth and nose may be suctioned with a small bulb syringe to remove any additional fluid. °· Your health care provider may then place the baby on your stomach. It is important to keep the baby from getting cold. To do this, the health care provider will dry the baby off, place the baby directly on your skin (with no blankets between you and the baby), and cover the baby with warm, dry blankets.   °· The umbilical cord is cut. °Third Stage °During the third stage of labor, your health care provider will deliver the placenta (afterbirth) and make sure your bleeding is under control. The delivery of the placenta usually takes about 5 minutes but can take up to 30 minutes. After the placenta is delivered, a medicine may be given either by IV or injection to help contract the uterus and control bleeding. If you are planning to breastfeed, you can try to do so now. °After you deliver the placenta, your uterus should contract and get very firm. If your uterus does not remain firm, your health care provider will massage it. This is important because the contraction of the uterus helps cut off bleeding at the site where the placenta was attached  to your uterus. If your uterus does not contract properly and stay firm, you may continue to bleed heavily. If there is a lot of bleeding, medicines may be given to contract the uterus and stop the bleeding.  °  °This information is not intended to replace advice given to you by your health care provider. Make sure you discuss any questions you have with your health care provider. °  °Document Released: 03/01/2008 Document Revised: 06/13/2014 Document Reviewed: 01/18/2012 °Elsevier Interactive Patient Education ©2016 [employer] ° °

## 2015-10-13 NOTE — Progress Notes (Signed)
Subjective:  Rebecca Ryan is a 25 y.o. G1P0 at [redacted]w[redacted]d being seen today for ongoing prenatal care.  She is currently monitored for the following issues for this low-risk pregnancy and has Supervision of normal first pregnancy and Language barrier, cultural differences on her problem list.  Patient reports no complaints and but worried about the size of the baby.  Contractions: Irritability. Vag. Bleeding: None.  Movement: Present. Denies leaking of fluid.   The following portions of the patient's history were reviewed and updated as appropriate: allergies, current medications, past family history, past medical history, past social history, past surgical history and problem list. Problem list updated.  Objective:   Filed Vitals:   10/13/15 1101  BP: 118/70  Pulse: 95  Weight: 179 lb 11.2 oz (81.511 kg)    Fetal Status: Fetal Heart Rate (bpm): 145   Movement: Present     General:  Alert, oriented and cooperative. Patient is in no acute distress.  Skin: Skin is warm and dry. No rash noted.   Cardiovascular: Normal heart rate noted  Respiratory: Normal respiratory effort, no problems with respiration noted  Abdomen: Soft, gravid, appropriate for gestational age. Pain/Pressure: Present     Pelvic: Vag. Bleeding: None     Cervical exam performed Dilation: Closed Effacement (%): 50 Station: -3  Extremities: Normal range of motion.  Edema: None  Mental Status: Normal mood and affect. Normal behavior. Normal judgment and thought content.   Urinalysis: Urine Protein: Negative Urine Glucose: Negative  Assessment and Plan:  Pregnancy: G1P0 at [redacted]w[redacted]d  1. Encounter for supervision of normal first pregnancy in third trimester     Reassured patient the baby is growing well and is appropriate size for age  Term labor symptoms and general obstetric precautions including but not limited to vaginal bleeding, contractions, leaking of fluid and fetal movement were reviewed in detail with the  patient. Please refer to After Visit Summary for other counseling recommendations.  Return in about 1 week (around 10/20/2015) for [facility].   Seabron Spates, CNM

## 2015-10-13 NOTE — Progress Notes (Signed)
Breastfeeding tip reviewed "Rebecca Ryan" [id number] video interpreter used

## 2015-10-15 ENCOUNTER — Encounter: Payer: Self-pay | Admitting: Advanced Practice Midwife

## 2015-10-23 ENCOUNTER — Ambulatory Visit (INDEPENDENT_AMBULATORY_CARE_PROVIDER_SITE_OTHER): Payer: Medicaid Other | Admitting: Advanced Practice Midwife

## 2015-10-23 VITALS — BP 125/78 | HR 74 | Wt 181.0 lb

## 2015-10-23 DIAGNOSIS — O99613 Diseases of the digestive system complicating pregnancy, third trimester: Secondary | ICD-10-CM

## 2015-10-23 DIAGNOSIS — Z3403 Encounter for supervision of normal first pregnancy, third trimester: Secondary | ICD-10-CM

## 2015-10-23 DIAGNOSIS — R319 Hematuria, unspecified: Secondary | ICD-10-CM

## 2015-10-23 DIAGNOSIS — K219 Gastro-esophageal reflux disease without esophagitis: Secondary | ICD-10-CM

## 2015-10-23 LAB — POCT URINALYSIS DIP (DEVICE)
Bilirubin Urine: NEGATIVE
GLUCOSE, UA: NEGATIVE mg/dL
Ketones, ur: NEGATIVE mg/dL
Nitrite: NEGATIVE
PROTEIN: NEGATIVE mg/dL
SPECIFIC GRAVITY, URINE: 1.01 (ref 1.005–1.030)
UROBILINOGEN UA: 0.2 mg/dL (ref 0.0–1.0)
pH: 6 (ref 5.0–8.0)

## 2015-10-23 MED ORDER — PANTOPRAZOLE SODIUM 40 MG PO TBEC: 40.0000 mg | DELAYED_RELEASE_TABLET | Freq: Every day | ORAL | Status: AC

## 2015-10-23 MED ORDER — PANTOPRAZOLE SODIUM 40 MG PO TBEC: 40.0000 mg | DELAYED_RELEASE_TABLET | Freq: Every day | ORAL | Status: DC

## 2015-10-23 NOTE — Progress Notes (Signed)
  Subjective:  Rebecca Ryan is a 25 y.o. G1P0 at [redacted]w[redacted]d being seen today for ongoing prenatal care.  She is currently monitored for the following issues for this low-risk pregnancy and has Supervision of normal first pregnancy and Language barrier, cultural differences on her problem list.  Patient reports no complaints.  Contractions: Irregular. Vag. Bleeding: None.  Movement: Present. Denies leaking of fluid.   The following portions of the patient's history were reviewed and updated as appropriate: allergies, current medications, past family history, past medical history, past social history, past surgical history and problem list. Problem list updated.  Objective:   Filed Vitals:   10/23/15 1019  BP: 125/78  Pulse: 74  Weight: 181 lb (82.1 kg)    Fetal Status: Fetal Heart Rate (bpm): 0 Fundal Height: 40 cm Movement: Present  Presentation: Vertex  General:  Alert, oriented and cooperative. Patient is in no acute distress.  Skin: Skin is warm and dry. No rash noted.   Cardiovascular: Normal heart rate noted  Respiratory: Normal respiratory effort, no problems with respiration noted  Abdomen: Soft, gravid, appropriate for gestational age. Pain/Pressure: Present     Pelvic: Vag. Bleeding: None     Cervical exam deferred        Extremities: Normal range of motion.  Edema: Trace  Mental Status: Normal mood and affect. Normal behavior. Normal judgment and thought content.   Urinalysis:      Assessment and Plan:  Pregnancy: G1P0 at [redacted]w[redacted]d  1. Encounter for supervision of normal first pregnancy in third trimester   Term labor symptoms and general obstetric precautions including but not limited to vaginal bleeding, contractions, leaking of fluid and fetal movement were reviewed in detail with the patient. Please refer to After Visit Summary for other counseling recommendations.  Discussed post-dates pregnancy , testing and IOL at 41 weeks.  Return in about 3 days (around  10/26/2015) for NST.   Rebecca Ryan, CNM

## 2015-10-23 NOTE — Patient Instructions (Addendum)
Food Choices for Gastroesophageal Reflux Disease, Adult When you have gastroesophageal reflux disease (GERD), the foods you eat and your eating habits are very important. Choosing the right foods can help ease the discomfort of GERD. WHAT GENERAL GUIDELINES DO I NEED TO FOLLOW?  Choose fruits, vegetables, whole grains, low-fat dairy products, and low-fat meat, fish, and poultry.  Limit fats such as oils, salad dressings, butter, nuts, and avocado.  Keep a food diary to identify foods that cause symptoms.  Avoid foods that cause reflux. These may be different for different people.  Eat frequent small meals instead of three large meals each day.  Eat your meals slowly, in a relaxed setting.  Limit fried foods.  Cook foods using methods other than frying.  Avoid drinking alcohol.  Avoid drinking large amounts of liquids with your meals.  Avoid bending over or lying down until 2-3 hours after eating. WHAT FOODS ARE NOT RECOMMENDED? The following are some foods and drinks that may worsen your symptoms: Vegetables Tomatoes. Tomato juice. Tomato and spaghetti sauce. Chili peppers. Onion and garlic. Horseradish. Fruits Oranges, grapefruit, and lemon (fruit and juice). Meats High-fat meats, fish, and poultry. This includes hot dogs, ribs, ham, sausage, salami, and bacon. Dairy Whole milk and chocolate milk. Sour cream. Cream. Butter. Ice cream. Cream cheese.  Beverages Coffee and tea, with or without caffeine. Carbonated beverages or energy drinks. Condiments Hot sauce. Barbecue sauce.  Sweets/Desserts Chocolate and cocoa. Donuts. Peppermint and spearmint. Fats and Oils High-fat foods, including [place] fries and potato chips. Other Vinegar. Strong spices, such as black pepper, white pepper, red pepper, cayenne, curry powder, cloves, ginger, and chili powder. The items listed above may not be a complete list of foods and beverages to avoid. Contact your dietitian for more  information.   This information is not intended to replace advice given to you by your health care provider. Make sure you discuss any questions you have with your health care provider.   Document Released: 05/23/2005 Document Revised: 06/13/2014 Document Reviewed: 03/27/2013 Elsevier Interactive Patient Education 2016 [employer].   Fetal Movement Counts Patient Name: __________________________________________________ Patient Due Date: ____________________ Performing a fetal movement count is highly recommended in high-risk pregnancies, but it is good for every pregnant woman to do. Your health care provider may ask you to start counting fetal movements at 28 weeks of the pregnancy. Fetal movements often increase:  After eating a full meal.  After physical activity.  After eating or drinking something sweet or cold.  At rest. Pay attention to when you feel the baby is most active. This will help you notice a pattern of your baby's sleep and wake cycles and what factors contribute to an increase in fetal movement. It is important to perform a fetal movement count at the same time each day when your baby is normally most active.  HOW TO COUNT FETAL MOVEMENTS 1. Find a quiet and comfortable area to sit or lie down on your left side. Lying on your left side provides the best blood and oxygen circulation to your baby. 2. Write down the day and time on a sheet of paper or in a journal. 3. Start counting kicks, flutters, swishes, rolls, or jabs in a 2-hour period. You should feel at least 10 movements within 2 hours. 4. If you do not feel 10 movements in 2 hours, wait 2-3 hours and count again. Look for a change in the pattern or not enough counts in 2 hours. SEEK MEDICAL CARE IF:  You feel less than 10 counts in 2 hours, tried twice.  There is no movement in over an hour.  The pattern is changing or taking longer each day to reach 10 counts in 2 hours.  You feel the baby is not moving  as he or she usually does. Date: ____________ Movements: ____________ Start time: ____________ Rebecca Ryan time: ____________  Date: ____________ Movements: ____________ Start time: ____________ Rebecca Ryan time: ____________ Date: ____________ Movements: ____________ Start time: ____________ Rebecca Ryan time: ____________ Date: ____________ Movements: ____________ Start time: ____________ Rebecca Ryan time: ____________ Date: ____________ Movements: ____________ Start time: ____________ Rebecca Ryan time: ____________ Date: ____________ Movements: ____________ Start time: ____________ Rebecca Ryan time: ____________ Date: ____________ Movements: ____________ Start time: ____________ Rebecca Ryan time: ____________ Date: ____________ Movements: ____________ Start time: ____________ Rebecca Ryan time: ____________  Date: ____________ Movements: ____________ Start time: ____________ Rebecca Ryan time: ____________ Date: ____________ Movements: ____________ Start time: ____________ Rebecca Ryan time: ____________ Date: ____________ Movements: ____________ Start time: ____________ Rebecca Ryan time: ____________ Date: ____________ Movements: ____________ Start time: ____________ Rebecca Ryan time: ____________ Date: ____________ Movements: ____________ Start time: ____________ Rebecca Ryan time: ____________ Date: ____________ Movements: ____________ Start time: ____________ Rebecca Ryan time: ____________ Date: ____________ Movements: ____________ Start time: ____________ Rebecca Ryan time: ____________  Date: ____________ Movements: ____________ Start time: ____________ Rebecca Ryan time: ____________ Date: ____________ Movements: ____________ Start time: ____________ Rebecca Ryan time: ____________ Date: ____________ Movements: ____________ Start time: ____________ Rebecca Ryan time: ____________ Date: ____________ Movements: ____________ Start time: ____________ Rebecca Ryan time: ____________ Date: ____________ Movements: ____________ Start time: ____________ Rebecca Ryan time: ____________ Date: ____________  Movements: ____________ Start time: ____________ Rebecca Ryan time: ____________ Date: ____________ Movements: ____________ Start time: ____________ Rebecca Ryan time: ____________  Date: ____________ Movements: ____________ Start time: ____________ Rebecca Ryan time: ____________ Date: ____________ Movements: ____________ Start time: ____________ Rebecca Ryan time: ____________ Date: ____________ Movements: ____________ Start time: ____________ Rebecca Ryan time: ____________ Date: ____________ Movements: ____________ Start time: ____________ Rebecca Ryan time: ____________ Date: ____________ Movements: ____________ Start time: ____________ Rebecca Ryan time: ____________ Date: ____________ Movements: ____________ Start time: ____________ Rebecca Ryan time: ____________ Date: ____________ Movements: ____________ Start time: ____________ Rebecca Ryan time: ____________  Date: ____________ Movements: ____________ Start time: ____________ Rebecca Ryan time: ____________ Date: ____________ Movements: ____________ Start time: ____________ Rebecca Ryan time: ____________ Date: ____________ Movements: ____________ Start time: ____________ Rebecca Ryan time: ____________ Date: ____________ Movements: ____________ Start time: ____________ Rebecca Ryan time: ____________ Date: ____________ Movements: ____________ Start time: ____________ Rebecca Ryan time: ____________ Date: ____________ Movements: ____________ Start time: ____________ Rebecca Ryan time: ____________ Date: ____________ Movements: ____________ Start time: ____________ Rebecca Ryan time: ____________  Date: ____________ Movements: ____________ Start time: ____________ Rebecca Ryan time: ____________ Date: ____________ Movements: ____________ Start time: ____________ Rebecca Ryan time: ____________ Date: ____________ Movements: ____________ Start time: ____________ Rebecca Ryan time: ____________ Date: ____________ Movements: ____________ Start time: ____________ Rebecca Ryan time: ____________ Date: ____________ Movements: ____________ Start time:  ____________ Rebecca Ryan time: ____________ Date: ____________ Movements: ____________ Start time: ____________ Rebecca Ryan time: ____________ Date: ____________ Movements: ____________ Start time: ____________ Rebecca Ryan time: ____________  Date: ____________ Movements: ____________ Start time: ____________ Rebecca Ryan time: ____________ Date: ____________ Movements: ____________ Start time: ____________ Rebecca Ryan time: ____________ Date: ____________ Movements: ____________ Start time: ____________ Rebecca Ryan time: ____________ Date: ____________ Movements: ____________ Start time: ____________ Rebecca Ryan time: ____________ Date: ____________ Movements: ____________ Start time: ____________ Rebecca Ryan time: ____________ Date: ____________ Movements: ____________ Start time: ____________ Rebecca Ryan time: ____________ Date: ____________ Movements: ____________ Start time: ____________ Rebecca Ryan time: ____________  Date: ____________ Movements: ____________ Start time: ____________ Rebecca Ryan time: ____________ Date: ____________ Movements: ____________ Start time: ____________ Rebecca Ryan time: ____________ Date: ____________ Movements: ____________ Start time: ____________ Rebecca Ryan time: ____________ Date: ____________ Movements: ____________ Start time: ____________ Rebecca Ryan  time: ____________ Date: ____________ Movements: ____________ Start time: ____________ Rebecca Ryan time: ____________ Date: ____________ Movements: ____________ Start time: ____________ Rebecca Ryan time: ____________   This information is not intended to replace advice given to you by your health care provider. Make sure you discuss any questions you have with your health care provider.   Document Released: 06/22/2006 Document Revised: 06/13/2014 Document Reviewed: 03/19/2012 Elsevier Interactive Patient Education [employer].

## 2015-10-23 NOTE — Progress Notes (Signed)
Pt has not left urine sample  Video Interpreter [phone]

## 2015-10-24 LAB — CULTURE, OB URINE

## 2015-10-27 ENCOUNTER — Telehealth (HOSPITAL_COMMUNITY): Payer: Self-pay | Admitting: *Deleted

## 2015-10-27 ENCOUNTER — Ambulatory Visit (INDEPENDENT_AMBULATORY_CARE_PROVIDER_SITE_OTHER): Payer: Medicaid Other | Admitting: *Deleted

## 2015-10-27 DIAGNOSIS — O480 Post-term pregnancy: Secondary | ICD-10-CM | POA: Diagnosis present

## 2015-10-27 DIAGNOSIS — Z36 Encounter for antenatal screening of mother: Secondary | ICD-10-CM

## 2015-10-27 NOTE — Progress Notes (Signed)
NST reviewed and reactive.  

## 2015-10-27 NOTE — Telephone Encounter (Signed)
Preadmission screen Interpreter number [phone]

## 2015-10-30 ENCOUNTER — Inpatient Hospital Stay (HOSPITAL_COMMUNITY): Payer: Medicaid Other | Admitting: Anesthesiology

## 2015-10-30 ENCOUNTER — Inpatient Hospital Stay (HOSPITAL_COMMUNITY)
Admission: RE | Admit: 2015-10-30 | Discharge: 2015-11-02 | DRG: 775 | Disposition: A | Discharge status: Home / Self Care | Payer: Medicaid Other | Source: Ambulatory Visit | Attending: Family Medicine | Admitting: Family Medicine

## 2015-10-30 ENCOUNTER — Encounter (HOSPITAL_COMMUNITY): Payer: Self-pay

## 2015-10-30 VITALS — BP 118/72 | HR 72 | Temp 98.1°F | Resp 18 | Ht 63.0 in | Wt 181.0 lb

## 2015-10-30 DIAGNOSIS — O0930 Supervision of pregnancy with insufficient antenatal care, unspecified trimester: Secondary | ICD-10-CM

## 2015-10-30 DIAGNOSIS — O480 Post-term pregnancy: Principal | ICD-10-CM | POA: Diagnosis present

## 2015-10-30 DIAGNOSIS — Z3400 Encounter for supervision of normal first pregnancy, unspecified trimester: Secondary | ICD-10-CM

## 2015-10-30 DIAGNOSIS — Z3A41 41 weeks gestation of pregnancy: Secondary | ICD-10-CM

## 2015-10-30 DIAGNOSIS — Z603 Acculturation difficulty: Secondary | ICD-10-CM

## 2015-10-30 DIAGNOSIS — Z3403 Encounter for supervision of normal first pregnancy, third trimester: Secondary | ICD-10-CM

## 2015-10-30 HISTORY — DX: Other specified health status: Z78.9

## 2015-10-30 LAB — CBC
HCT: 35.3 % — ABNORMAL LOW (ref 36.0–46.0)
Hemoglobin: 12.6 g/dL (ref 12.0–15.0)
MCH: 33.5 pg (ref 26.0–34.0)
MCHC: 35.7 g/dL (ref 30.0–36.0)
MCV: 93.9 fL (ref 78.0–100.0)
Platelets: 167 10*3/uL (ref 150–400)
RBC: 3.76 MIL/uL — ABNORMAL LOW (ref 3.87–5.11)
RDW: 12.7 % (ref 11.5–15.5)
WBC: 13.5 10*3/uL — AB (ref 4.0–10.5)

## 2015-10-30 LAB — TYPE AND SCREEN
ABO/RH(D): O POS
ANTIBODY SCREEN: NEGATIVE

## 2015-10-30 LAB — ABO/RH: ABO/RH(D): O POS

## 2015-10-30 MED ORDER — LACTATED RINGERS IV SOLN
INTRAVENOUS | Status: DC
  Administered 2015-10-30 (×2): via INTRAVENOUS

## 2015-10-30 MED ORDER — PHENYLEPHRINE 40 MCG/ML (10ML) SYRINGE FOR IV PUSH (FOR BLOOD PRESSURE SUPPORT)
80.0000 ug | PREFILLED_SYRINGE | INTRAVENOUS | Status: DC | PRN
  Filled 2015-10-30: qty 5
  Filled 2015-10-30: qty 10

## 2015-10-30 MED ORDER — PHENYLEPHRINE 40 MCG/ML (10ML) SYRINGE FOR IV PUSH (FOR BLOOD PRESSURE SUPPORT): 80.0000 ug | PREFILLED_SYRINGE | INTRAVENOUS | Status: DC | PRN

## 2015-10-30 MED ORDER — EPHEDRINE 5 MG/ML INJ: 10.0000 mg | INTRAVENOUS | Status: DC | PRN

## 2015-10-30 MED ORDER — EPHEDRINE 5 MG/ML INJ
10.0000 mg | INTRAVENOUS | Status: DC | PRN
  Filled 2015-10-30: qty 2

## 2015-10-30 MED ORDER — SOD CITRATE-CITRIC ACID 500-334 MG/5ML PO SOLN: 30.0000 mL | ORAL | Status: DC | PRN

## 2015-10-30 MED ORDER — FENTANYL CITRATE (PF) 100 MCG/2ML IJ SOLN
50.0000 ug | INTRAMUSCULAR | Status: DC | PRN
Start: 2015-10-30 — End: 2015-10-31
  Administered 2015-10-30 (×2): 50 ug via INTRAVENOUS
  Filled 2015-10-30 (×2): qty 2

## 2015-10-30 MED ORDER — LIDOCAINE HCL (PF) 1 % IJ SOLN
INTRAMUSCULAR | Status: DC | PRN
  Administered 2015-10-30: 3 mL
  Administered 2015-10-30: 2 mL
  Administered 2015-10-30: 5 mL

## 2015-10-30 MED ORDER — LACTATED RINGERS IV SOLN: 500.0000 mL | Freq: Once | INTRAVENOUS | Status: DC

## 2015-10-30 MED ORDER — MISOPROSTOL 25 MCG QUARTER TABLET
25.0000 ug | ORAL_TABLET | ORAL | Status: DC | PRN
  Administered 2015-10-30: 25 ug via VAGINAL
  Filled 2015-10-30: qty 0.25
  Filled 2015-10-30: qty 1
  Filled 2015-10-30: qty 0.25

## 2015-10-30 MED ORDER — FENTANYL 2.5 MCG/ML BUPIVACAINE 1/10 % EPIDURAL INFUSION (WH - ANES)
14.0000 mL/h | INTRAMUSCULAR | Status: DC | PRN
  Administered 2015-10-30 (×2): 14 mL/h via EPIDURAL
  Filled 2015-10-30: qty 125

## 2015-10-30 MED ORDER — PHENYLEPHRINE 40 MCG/ML (10ML) SYRINGE FOR IV PUSH (FOR BLOOD PRESSURE SUPPORT)
80.0000 ug | PREFILLED_SYRINGE | INTRAVENOUS | Status: DC | PRN
  Filled 2015-10-30: qty 10
  Filled 2015-10-30: qty 5

## 2015-10-30 MED ORDER — ACETAMINOPHEN 325 MG PO TABS: 650.0000 mg | ORAL_TABLET | ORAL | Status: DC | PRN

## 2015-10-30 MED ORDER — OXYCODONE-ACETAMINOPHEN 5-325 MG PO TABS: 1.0000 | ORAL_TABLET | ORAL | Status: DC | PRN

## 2015-10-30 MED ORDER — LIDOCAINE HCL (PF) 1 % IJ SOLN
30.0000 mL | INTRAMUSCULAR | Status: DC | PRN
  Administered 2015-10-31: 30 mL via SUBCUTANEOUS
  Filled 2015-10-30: qty 30

## 2015-10-30 MED ORDER — LACTATED RINGERS IV SOLN: 500.0000 mL | INTRAVENOUS | Status: DC | PRN

## 2015-10-30 MED ORDER — OXYCODONE-ACETAMINOPHEN 5-325 MG PO TABS
2.0000 | ORAL_TABLET | ORAL | Status: DC | PRN
Start: 2015-10-30 — End: 2015-10-31

## 2015-10-30 MED ORDER — LACTATED RINGERS IV SOLN
500.0000 mL | Freq: Once | INTRAVENOUS | Status: AC
  Administered 2015-10-30: 500 mL via INTRAVENOUS

## 2015-10-30 MED ORDER — ONDANSETRON HCL 4 MG/2ML IJ SOLN: 4.0000 mg | Freq: Four times a day (QID) | INTRAMUSCULAR | Status: DC | PRN

## 2015-10-30 MED ORDER — OXYTOCIN 40 UNITS IN LACTATED RINGERS INFUSION - SIMPLE MED: 2.5000 [IU]/h | INTRAVENOUS | Status: DC

## 2015-10-30 MED ORDER — OXYTOCIN BOLUS FROM INFUSION
500.0000 mL | INTRAVENOUS | Status: DC
Start: 2015-10-30 — End: 2015-10-31
  Administered 2015-10-31: 500 mL via INTRAVENOUS

## 2015-10-30 MED ORDER — OXYTOCIN 40 UNITS IN LACTATED RINGERS INFUSION - SIMPLE MED
1.0000 m[IU]/min | INTRAVENOUS | Status: DC
  Administered 2015-10-30: 2 m[IU]/min via INTRAVENOUS
  Filled 2015-10-30: qty 1000

## 2015-10-30 MED ORDER — TERBUTALINE SULFATE 1 MG/ML IJ SOLN
0.2500 mg | Freq: Once | INTRAMUSCULAR | Status: DC | PRN
  Filled 2015-10-30: qty 1

## 2015-10-30 MED ORDER — DIPHENHYDRAMINE HCL 50 MG/ML IJ SOLN: 12.5000 mg | INTRAMUSCULAR | Status: DC | PRN

## 2015-10-30 NOTE — Progress Notes (Signed)
1830 pt's pain assessed and pt asked if she needed pain relief. Pt requests more iv pain med via interpreter Dalia # [id number]. Declines needs to go to Bathroom. Foley bulb intact even after traction applied.

## 2015-10-30 NOTE — Progress Notes (Cosign Needed)
LABOR PROGRESS NOTE  Eviana Nait Ssi Albertine Patricia is a 25 y.o. G1P0000 at [redacted]w[redacted]d  admitted for pdiol  Subjective: Pain improved after epidural  Objective: BP 107/63 mmHg  Pulse 66  Temp(Src) 98.7 F (37.1 C) (Oral)  Resp 20  Ht 5\' 3"  (1.6 m)  Wt 181 lb (82.101 kg)  BMI 32.07 kg/m2  SpO2 100%  LMP 01/16/2015 or  Filed Vitals:   10/30/15 1841 10/30/15 1843 10/30/15 1917 10/30/15 2052  BP:  107/63    Pulse:  66    Temp:      TempSrc:      Resp: 20 18 20 20   Height:      Weight:      SpO2:    100%    140/mod/-a/early decel  Dilation: 3 Effacement (%): 60 Cervical Position: Middle Station: -3 Presentation: Vertex Exam by:: MBhambri,cnm  Labs: Lab Results  Component Value Date   WBC 13.5* 10/30/2015   HGB 12.6 10/30/2015   HCT 35.3* 10/30/2015   MCV 93.9 10/30/2015   PLT 167 10/30/2015    Patient Active Problem List   Diagnosis Date Noted  . Post term pregnancy 10/30/2015  . Late prenatal care affecting pregnancy 10/30/2015  . Language barrier, cultural differences 10/06/2015  . Supervision of normal first pregnancy 07/07/2015    Assessment / Plan: 25 y.o. G1P0000 at [redacted]w[redacted]d here for pdiol  Labor:  S/p foley and cytotec, foley out, 3-4 cm per rn exam, starting pticoin Fetal Wellbeing:  Cat 1 Pain Control:  epidural Anticipated MOD:  vag  Desma Maxim, MD 10/30/2015, 10:00 PM

## 2015-10-30 NOTE — Anesthesia Preprocedure Evaluation (Signed)
Anesthesia Evaluation  Patient identified by MRN, date of birth, ID band Patient awake    Reviewed: Allergy & Precautions, Patient's Chart, lab work & pertinent test results  Airway Mallampati: II  TM Distance: >3 FB     Dental   Pulmonary neg pulmonary ROS,    Pulmonary exam normal        Cardiovascular negative cardio ROS Normal cardiovascular exam     Neuro/Psych negative neurological ROS     GI/Hepatic negative GI ROS, Neg liver ROS,   Endo/Other  negative endocrine ROS  Renal/GU negative Renal ROS     Musculoskeletal   Abdominal   Peds  Hematology negative hematology ROS (+)   Anesthesia Other Findings   Reproductive/Obstetrics (+) Pregnancy                             Lab Results  Component Value Date   WBC 13.5* 10/30/2015   HGB 12.6 10/30/2015   HCT 35.3* 10/30/2015   MCV 93.9 10/30/2015   PLT 167 10/30/2015    Anesthesia Physical Anesthesia Plan  ASA: II  Anesthesia Plan: Epidural   Post-op Pain Management:    Induction:   Airway Management Planned: Natural Airway  Additional Equipment:   Intra-op Plan:   Post-operative Plan:   Informed Consent: I have reviewed the patients History and Physical, chart, labs and discussed the procedure including the risks, benefits and alternatives for the proposed anesthesia with the patient or authorized representative who has indicated his/her understanding and acceptance.     Plan Discussed with:   Anesthesia Plan Comments:         Anesthesia Quick Evaluation

## 2015-10-30 NOTE — H&P (Signed)
LABOR ADMISSION HISTORY AND PHYSICAL  Rebecca Ryan is a 25 y.o. female G1P0000 with IUP at [redacted]w[redacted]d by L/25 presenting for IOL for postdates pregenancy. She reports no contraction. Fetal movement present and at baseline. Denies fluid leak or gush, vaginal bleeding,  headaches, blurry vision, RUQ pain or peripheral edema. She plans on breast feeding. She is undecided for birth control.    Dating: By L/25 --->  Estimated Date of Delivery: 10/23/15  Clinic [facility] Prenatal Labs  Dating LMP, consistent with 25 week [place] Blood type: O/POS/-- (01/31 1421) O pos  Genetic Screen Too late     Antibody:NEG (01/31 1421)Neg  Anatomic US WNL > spine views nml Rubella: 4.94 (01/31 1421)Immune  GTT  Third trimester: 75 RPR: NON REAC (01/31 1421) NR  Flu vaccine Done 1/31 HBsAg: NEGATIVE (01/31 1421) Neg  TDaP vaccine  08/18/15                                              HIV: NONREACTIVE (01/31 1421) NR  Baby Food  breast                                        [id number] (04/25 0000) neg  Contraception  Pap:WNL  Circumcision  n/a female   Pediatrician  Given list   Support Person  Bridget Hartshorn (husband)     Prenatal History/Complications:  Past Medical History: Past Medical History  Diagnosis Date  . Medical history non-contributory     Past Surgical History: History reviewed. No pertinent past surgical history.  Obstetrical History: OB History    Gravida Para Term Preterm AB TAB SAB Ectopic Multiple Living   1 0 0 0 0 0 0 0 0 0       Social History: Social History   Social History  . Marital Status: Married    Spouse Name: N/A  . Number of Children: N/A  . Years of Education: N/A   Social History Main Topics  . Smoking status: Never Smoker   . Smokeless tobacco: None  . Alcohol Use: No  . Drug Use: No  . Sexual Activity: Yes   Other Topics Concern  . None   Social History Narrative    Family History: History reviewed. No pertinent family history.  Allergies: No Known  Allergies  Prescriptions prior to admission  Medication Sig Dispense Refill Last Dose  . Prenatal Multivit-Min-Fe-FA (PRENATAL VITAMINS) 0.8 MG tablet Take 1 tablet by mouth daily. 30 tablet 12 10/29/2015 at Unknown time  . pantoprazole (PROTONIX) 40 MG tablet Take 1 tablet (40 mg total) by mouth daily. (Patient taking differently: Take 40 mg by mouth daily as needed (for heartburn/reflux). ) 30 tablet 0 prn    Review of Systems  Constitutional: Negative for fever and chills.  HENT: Negative for congestion and sore throat.   Eyes: Negative for pain and visual disturbance.  Respiratory: Negative for cough, chest tightness and shortness of breath.   Cardiovascular: Negative for chest pain.  Gastrointestinal: Negative for nausea, vomiting, abdominal pain and diarrhea.  Endocrine: Negative for cold intolerance and heat intolerance.  Genitourinary: Negative for dysuria and flank pain.  Musculoskeletal: Negative for back pain.  Skin: Negative for rash.  Allergic/Immunologic: Negative for food allergies.  Neurological: Negative for dizziness and light-headedness.  Psychiatric/Behavioral: Negative for agitation.    Blood pressure 129/74, pulse 69, temperature 97.8 F (36.6 C), temperature source Oral, resp. rate 20, height 5\' 3"  (1.6 m), weight 181 lb (82.101 kg), last menstrual period 01/16/2015. GEN: appearance: alert, cooperative and appears stated age RESP: clear to auscultation bilaterally, no increased WOB CVS:: regular rate and rhythm, no murmurs, no sign of DVT, +2 DP GI: soft, non-tender; bowel sounds normal MSK: WWP, Homans sign is negative,  NEURO: patellar DTRs  PSYCH:  Pelvic Exam: Cervical exam: Dilation: Fingertip Effacement (%): 50 Station: -3 Exam by:: dr newton Presentation: cephalic Uterine activityNone  Fetal monitoringBaseline: 130 bpm, Variability: Good {> 6 bpm), Accelerations: Reactive and Decelerations: Absent  Prenatal labs: ABO, Rh: O/POS/-- (01/31  1421) Antibody: NEG (01/31 1421) Rubella: IMMUNE RPR: NON REAC (01/31 1421)  HBsAg: NEGATIVE (01/31 1421)  HIV: NONREACTIVE (01/31 1421)  GBS: Negative (04/25 0000)  1 hr Glucola 75-wnl Genetic screening  Too late  Anatomy US wnl  Prenatal Transfer Tool  Maternal Diabetes: No Genetic Screening: Declined-- too late to care Maternal Ultrasounds/Referrals: Normal Fetal Ultrasounds or other Referrals:  None Maternal Substance Abuse:  No Significant Maternal Medications:  None Significant Maternal Lab Results: Lab values include: Group B Strep negative  No results found for this or any previous visit (from the past 24 hour(s)).  Patient Active Problem List   Diagnosis Date Noted  . Post term pregnancy 10/30/2015  . Language barrier, cultural differences 10/06/2015  . Supervision of normal first pregnancy 07/07/2015   Assessment: Rebecca Ryan is a 25 y.o. G1P0000 at [redacted]w[redacted]d here for IOL for postdates  #Labor: Admit to LD. Cytotec 25mg  q 4 hours. Plan for FB when favorable-currently very posterior.  #Pain: No pain currently #FWB:  Cat I, intermittent monitoring given low risk.  #ID:  GBS neg #MOF:  Breast #MOC: undecided #Circ:  NA  Caren Macadam 10/30/2015, 11:37 AM

## 2015-10-30 NOTE — Anesthesia Pain Management Evaluation Note (Signed)
  CRNA Pain Management Visit Note  Patient: Rebecca Ryan, 25 y.o., female  "Hello I am a member of the anesthesia team at [facility]. We have an anesthesia team available at all times to provide care throughout the hospital, including epidural management and anesthesia for C-section. I don't know your plan for the delivery whether it a natural birth, water birth, IV sedation, nitrous supplementation, doula or epidural, but we want to meet your pain goals."   1.Was your pain managed to your expectations on prior hospitalizations?   No prior hospitalizations  2.What is your expectation for pain management during this hospitalization?     Labor support without medications  3.How can we help you reach that goal? May opt for IV pain meds or epidural if needed.  Record the patient's initial score and the patient's pain goal.   Pain: 5  Pain Goal: 7 [facility] wants you to be able to say your pain was always managed very well.  Casimer Lanius 10/30/2015

## 2015-10-30 NOTE — Progress Notes (Signed)
Language line interpreter used to explain foley bulb procedure and evaluate contractions. Interpreter [id number].

## 2015-10-30 NOTE — Progress Notes (Signed)
Rebecca Ryan is a 25 y.o. G1P0000 at [redacted]w[redacted]d by LMP admitted for induction of labor due to Post dates. Due date 10/23/15.  Subjective: Some pain with ctx, desires pain meds.  Objective: BP 116/62 mmHg  Pulse 83  Temp(Src) 98.7 F (37.1 C) (Oral)  Resp 18  Ht 5\' 3"  (1.6 m)  Wt 181 lb (82.101 kg)  BMI 32.07 kg/m2  LMP 01/16/2015     FHT:  FHR: 150 bpm, variability: moderate,  accelerations:  Present,  decelerations:  Absent UC:   regular, every 3-4 minutes SVE:   Dilation: 3 Effacement (%): 60 Station: -3 Exam by:: MBhambri,cnm Cervical balloon placed  Labs: Lab Results  Component Value Date   WBC 13.5* 10/30/2015   HGB 12.6 10/30/2015   HCT 35.3* 10/30/2015   MCV 93.9 10/30/2015   PLT 167 10/30/2015    Assessment / Plan: IOL d/t post term, progressing well after Cytotec #1  Labor: Progressing normally Preeclampsia:  no signs or symptoms of toxicity Fetal Wellbeing:  Category I Pain Control:  IV pain meds I/D:  n/a Anticipated MOD:  NSVD  Julianne Handler 10/30/2015, 5:09 PM

## 2015-10-30 NOTE — Anesthesia Procedure Notes (Signed)
Epidural Patient location during procedure: OB  Staffing Anesthesiologist: Suzette Battiest Performed by: anesthesiologist   Preanesthetic Checklist Completed: patient identified, site marked, surgical consent, pre-op evaluation, timeout performed, IV checked, risks and benefits discussed and monitors and equipment checked  Epidural Patient position: sitting Prep: site prepped and draped and DuraPrep Patient monitoring: continuous pulse ox and blood pressure Approach: midline Location: L4-L5 Injection technique: LOR saline  Needle:  Needle type: Tuohy  Needle gauge: 17 G Needle length: 9 cm and 9 Needle insertion depth: 5 cm cm Catheter type: closed end flexible Catheter size: 19 Gauge Catheter at skin depth: 11 (initially 10cm. Advanced to 11cm when laid in right lat decub.) cm Test dose: negative  Assessment Events: blood not aspirated, injection not painful, no injection resistance, negative IV test and no paresthesia

## 2015-10-30 NOTE — Progress Notes (Signed)
[place], [place] used to discuss POC- SVE to determine labor status.

## 2015-10-31 DIAGNOSIS — O480 Post-term pregnancy: Secondary | ICD-10-CM

## 2015-10-31 DIAGNOSIS — Z3A41 41 weeks gestation of pregnancy: Secondary | ICD-10-CM

## 2015-10-31 LAB — RPR: RPR Ser Ql: NONREACTIVE

## 2015-10-31 MED ORDER — SIMETHICONE 80 MG PO CHEW: 80.0000 mg | CHEWABLE_TABLET | ORAL | Status: DC | PRN

## 2015-10-31 MED ORDER — WITCH HAZEL-GLYCERIN EX PADS: 1.0000 "application " | MEDICATED_PAD | CUTANEOUS | Status: DC | PRN

## 2015-10-31 MED ORDER — SENNOSIDES-DOCUSATE SODIUM 8.6-50 MG PO TABS
2.0000 | ORAL_TABLET | ORAL | Status: DC
  Administered 2015-11-01 (×2): 2 via ORAL
  Filled 2015-10-31: qty 2

## 2015-10-31 MED ORDER — ONDANSETRON HCL 4 MG PO TABS: 4.0000 mg | ORAL_TABLET | ORAL | Status: DC | PRN

## 2015-10-31 MED ORDER — DIBUCAINE 1 % RE OINT: 1.0000 "application " | TOPICAL_OINTMENT | RECTAL | Status: DC | PRN

## 2015-10-31 MED ORDER — ZOLPIDEM TARTRATE 5 MG PO TABS: 5.0000 mg | ORAL_TABLET | Freq: Every evening | ORAL | Status: DC | PRN

## 2015-10-31 MED ORDER — BENZOCAINE-MENTHOL 20-0.5 % EX AERO
1.0000 "application " | INHALATION_SPRAY | CUTANEOUS | Status: DC | PRN
  Administered 2015-10-31 – 2015-11-02 (×2): 1 via TOPICAL
  Filled 2015-10-31 (×2): qty 56

## 2015-10-31 MED ORDER — ACETAMINOPHEN 325 MG PO TABS: 650.0000 mg | ORAL_TABLET | ORAL | Status: DC | PRN

## 2015-10-31 MED ORDER — DIPHENHYDRAMINE HCL 25 MG PO CAPS: 25.0000 mg | ORAL_CAPSULE | Freq: Four times a day (QID) | ORAL | Status: DC | PRN

## 2015-10-31 MED ORDER — TETANUS-DIPHTH-ACELL PERTUSSIS 5-2.5-18.5 LF-MCG/0.5 IM SUSP: 0.5000 mL | Freq: Once | INTRAMUSCULAR | Status: DC

## 2015-10-31 MED ORDER — COCONUT OIL OIL: 1.0000 "application " | TOPICAL_OIL | Status: DC | PRN

## 2015-10-31 MED ORDER — OXYCODONE HCL 5 MG PO TABS: 5.0000 mg | ORAL_TABLET | ORAL | Status: DC | PRN

## 2015-10-31 MED ORDER — OXYCODONE HCL 5 MG PO TABS
10.0000 mg | ORAL_TABLET | ORAL | Status: DC | PRN
Start: 2015-10-31 — End: 2015-11-02

## 2015-10-31 MED ORDER — PRENATAL MULTIVITAMIN CH
1.0000 | ORAL_TABLET | Freq: Every day | ORAL | Status: DC
  Administered 2015-10-31 – 2015-11-02 (×3): 1 via ORAL
  Filled 2015-10-31 (×3): qty 1

## 2015-10-31 MED ORDER — MEASLES, MUMPS & RUBELLA VAC ~~LOC~~ INJ
0.5000 mL | INJECTION | Freq: Once | SUBCUTANEOUS | Status: DC
  Filled 2015-10-31: qty 0.5

## 2015-10-31 MED ORDER — IBUPROFEN 600 MG PO TABS
600.0000 mg | ORAL_TABLET | Freq: Four times a day (QID) | ORAL | Status: DC
  Administered 2015-10-31 – 2015-11-02 (×10): 600 mg via ORAL
  Filled 2015-10-31 (×9): qty 1

## 2015-10-31 MED ORDER — ONDANSETRON HCL 4 MG/2ML IJ SOLN: 4.0000 mg | INTRAMUSCULAR | Status: DC | PRN

## 2015-10-31 NOTE — Anesthesia Postprocedure Evaluation (Signed)
Anesthesia Post Note  Patient: Antonella Nait Ssi Lahcen  Procedure(s) Performed: * No procedures listed *  Patient location during evaluation: Mother Baby Anesthesia Type: Epidural Level of consciousness: awake and alert Pain management: satisfactory to patient Vital Signs Assessment: post-procedure vital signs reviewed and stable Respiratory status: respiratory function stable Cardiovascular status: stable Postop Assessment: no headache, no backache, epidural receding, patient able to bend at knees, no signs of nausea or vomiting and adequate PO intake Anesthetic complications: no Comments: Comfort level was assessed by AnesthesiaTeam and the patient was pleased with the care, interventions, and services provided by the Department of Anesthesia.     Last Vitals:  Filed Vitals:   10/31/15 0537 10/31/15 1003  BP: 120/53 101/68  Pulse: 72 78  Temp: 36.8 C 36.8 C  Resp: 18 18    Last Pain:  Filed Vitals:   10/31/15 1003  PainSc: 0-No pain   Pain Goal: Patients Stated Pain Goal: 7 (10/30/15 1624)               Rhian Funari

## 2015-10-31 NOTE — Lactation Note (Signed)
This note was copied from a baby's chart. Lactation Consultation Note  Patient Name: Rebecca Ryan Today's Date: 10/31/2015 Reason for consult: Initial assessment - Dad has signed the consent to interpret for mom Arabic  Baby is 58 plus hours old and has been to the breast for attempts  Per dad the baby has been spitty and has had 2 poops.  Dad showed LC the last wet and stool diaper ( small wet , and medium mec ( thick ).  LC undressed baby 4 layers of clothing and blankets. LC stressed not over bundling baby.  Skin to skin feedings, LC burped baby , ( small amount of clear secretions ), loud burp , and  Placed baby in the laid back position and baby was on and off at 1st and then latched for 8 mins with  Several swallows. Areola less edematous and more compressible and able to hand express several drops.  Mom comfortable with latch. Baby released on her own. LC recommended working on the laid back position due  To the baby doing so well.  Mother informed of post-discharge support and given phone number to the lactation department, including services for phone call assistance; out-patient appointments; and breastfeeding support group. List of other breastfeeding resources in the community given in the handout. Encouraged mother to call for problems or concerns related to breastfeeding.   Maternal Data Has patient been taught Hand Expression?: Yes Does the patient have breastfeeding experience prior to this delivery?: No  Feeding Feeding Type: Breast Fed Length of feed: 8 min (on and off at 1st , swallows noted )  LATCH Score/Interventions Latch: Repeated attempts needed to sustain latch, nipple held in mouth throughout feeding, stimulation needed to elicit sucking reflex. Intervention(s): Adjust position;Assist with latch;Breast massage;Breast compression  Audible Swallowing: A few with stimulation (increased with breast compressions )  Type of Nipple: Everted at rest and  after stimulation (swelling in areola area noted )  Comfort (Breast/Nipple): Soft / non-tender     Hold (Positioning): Assistance needed to correctly position infant at breast and maintain latch. Intervention(s): Breastfeeding basics reviewed;Support Pillows;Position options;Skin to skin  LATCH Score: 7  Lactation Tools Discussed/Used     Consult Status Consult Status: Follow-up Date: 11/01/15 Follow-up type: In-patient    Myer Haff 10/31/2015, 4:36 PM

## 2015-11-01 ENCOUNTER — Encounter (HOSPITAL_COMMUNITY): Payer: Self-pay

## 2015-11-01 NOTE — Progress Notes (Signed)
Post Partum Day #1 Subjective:  Rebecca Ryan is a 25 y.o. G1P0000 [redacted]w[redacted]d s/p SVD following induction of labor for postdates.  No acute events overnight.  Pt denies problems with ambulating, voiding or po intake.  She denies nausea or vomiting.  Pain is well controlled.  She has had flatus. She has not had bowel movement.  Lochia Minimal.  Plan for birth control is undecided.  Method of Feeding: Breast  Objective: Blood pressure 107/66, pulse 83, temperature 98.7 F (37.1 C), temperature source Oral, resp. rate 19, height 5\' 3"  (1.6 m), weight 181 lb (82.101 kg), last menstrual period 01/16/2015, SpO2 99 %.  Physical Exam:  General: alert, cooperative and no distress Lochia:normal flow Chest: CTAB Heart: RRR no m/r/g Abdomen: +BS, soft, nontender,  Uterine Fundus: firm DVT Evaluation: No evidence of DVT seen on physical exam. Extremities: no edema   Recent Labs  10/30/15 1116  HGB 12.6  HCT 35.3*    Assessment/Plan:  ASSESSMENT: Rebecca Ryan is a 25 y.o. G1P0000 [redacted]w[redacted]d s/p SVD.  Plan for discharge tomorrow, Breastfeeding, Lactation consult and Contraception Undecided--discussed different methods.   LOS: 2 days   [facility] 11/01/2015, 7:06 AM

## 2015-11-02 MED ORDER — IBUPROFEN 600 MG PO TABS: 600.0000 mg | ORAL_TABLET | Freq: Four times a day (QID) | ORAL | Status: AC

## 2015-11-02 NOTE — Discharge Summary (Signed)
OB Discharge Summary  Patient Name: Rebecca Ryan DOB: 1991-01-11 MRN: [id number]  Date of admission: 10/30/2015 Delivering MD: Donnamae Jude   Date of discharge: 11/02/2015  Admitting diagnosis: 40wks induction  Intrauterine pregnancy: [redacted]w[redacted]d     Secondary diagnosis:Active Problems:   Supervision of normal first pregnancy   Language barrier, cultural differences   Post term pregnancy   Late prenatal care affecting pregnancy  Additional problems:none     Discharge diagnosis: Term Pregnancy Delivered                                                                     Post partum procedures:n/a  Augmentation: Pitocin  Complications: None  Hospital course:  Induction of Labor With Vaginal Delivery   25 y.o. yo G1P1001 at [redacted]w[redacted]d was admitted to the hospital 10/30/2015 for induction of labor.  Indication for induction: Postdates.  Patient had an uncomplicated labor course as follows: Membrane Rupture Time/Date: 10:10 PM ,10/30/2015   Intrapartum Procedures: Episiotomy: None [1]                                         Lacerations:  2nd degree [3]  Patient had delivery of a Viable infant.  Information for the patient's newborn:  Nait Ssi Albertine Patricia Girl Labrina [id number]  Delivery Method: Vaginal, Spontaneous Delivery (Filed from Delivery Summary)   10/31/2015  Details of delivery can be found in separate delivery note.  Patient had a routine postpartum course. Patient is discharged home 11/02/2015.   Physical exam  Filed Vitals:   10/31/15 1740 11/01/15 0602 11/01/15 1712 11/02/15 0525  BP: 103/59 107/66 112/69 118/72  Pulse: 84 83 67 72  Temp: 98.3 F (36.8 C) 98.7 F (37.1 C) 98 F (36.7 C) 98.1 F (36.7 C)  TempSrc: Oral Oral Oral Oral  Resp: 16 19 16 18   Height:      Weight:      SpO2: 99%  100%    General: alert, cooperative and no distress Lochia: appropriate Uterine Fundus: firm Incision: N/A DVT Evaluation: Negative Homan's sign. No cords or calf  tenderness. Labs: Lab Results  Component Value Date   WBC 13.5* 10/30/2015   HGB 12.6 10/30/2015   HCT 35.3* 10/30/2015   MCV 93.9 10/30/2015   PLT 167 10/30/2015   No flowsheet data found.  Discharge instruction: per After Visit Summary and "Baby and Me Booklet".  After Visit Meds:    Medication List    ASK your doctor about these medications        pantoprazole 40 MG tablet  Commonly known as:  PROTONIX  Take 1 tablet (40 mg total) by mouth daily.     Prenatal Vitamins 0.8 MG tablet  Take 1 tablet by mouth daily.        Diet: routine diet  Activity: Advance as tolerated. Pelvic rest for 6 weeks.   Outpatient follow up:6 weeks Follow up Appt:No future appointments. Follow up visit: No Follow-up on file.  Postpartum contraception: Undecided  Newborn Data: Live born female  Birth Weight: 7 lb 15 oz (3600 g) APGAR: 9, 8  Baby Feeding: Breast  Disposition:home with mother   11/02/2015 Koren Shiver, CNM

## 2015-11-02 NOTE — Progress Notes (Signed)
UR chart review completed.  

## 2015-11-02 NOTE — Lactation Note (Signed)
This note was copied from a baby's chart. Lactation Consultation Note  Patient Name: Rebecca Ryan [id number] Date: 11/02/2015 Reason for consult: Follow-up assessment;Other (Comment);Infant weight loss (7% weight loss )  Baby is 63 hours old and has breastfeeding more consistently in the last 24 hours compared to the previous 24 hours.  @ the start of the consult baby awake and hungry, noted to be cluster feeding. Baby had fed in the last hour.  [place] worked with mom to change position to football with adequate support, and depth achieved , multiply swallows noted  And mom comfortable. LC also showed dad how he could help mom to obtain depth consistently to enhance weight gain.  Breast are fuller , warmer today ( good sign for milk coming in , and able to express milk easily). Per dad per mom more sensitive.  LC mentioned that is a sign milk is coming in. No breakdown on either nipple. Sore nipple and engorgement prevention and tx reviewed.  Mom already has a hand pump and LC instructed mom on use shells ( dad interpreting ) to help make the areola more compressible for  A deeper latch. LC reviewed basics for latching. Mom encouraged to feed baby 8-12 times/24 hours and with feeding cues.  Skin to  Skin feedings until the baby is gaining well and can stay awake for the feeding.  Doc flow sheets reviewed WNL for D/C.  Dr. Nevada Crane aware of the findings for [facility] consult.   Maternal Data Has patient been taught Hand Expression?: Yes  Feeding Feeding Type: Breast Fed Length of feed: 10 min (multiply swallows , increased with breast compressions )  LATCH Score/Interventions Latch: Grasps breast easily, tongue down, lips flanged, rhythmical sucking. Intervention(s): Skin to skin;Teach feeding cues;Waking techniques  Audible Swallowing: Spontaneous and intermittent  Type of Nipple: Everted at rest and after stimulation  Comfort (Breast/Nipple): Filling, red/small blisters or bruises,  mild/mod discomfort  Problem noted: Filling Interventions (Filling): Reverse pressure  Hold (Positioning): Assistance needed to correctly position infant at breast and maintain latch. Intervention(s): Breastfeeding basics reviewed;Support Pillows;Skin to skin  LATCH Score: 8  Lactation Tools Discussed/Used Tools: Pump;Shells Breast pump type: Manual Pump Review: Setup, frequency, and cleaning (reviewed )   Consult Status Consult Status: Complete    Myer Haff 11/02/2015, 9:54 AM

## 2015-12-22 ENCOUNTER — Ambulatory Visit (INDEPENDENT_AMBULATORY_CARE_PROVIDER_SITE_OTHER): Payer: Medicaid Other | Admitting: Advanced Practice Midwife

## 2015-12-22 ENCOUNTER — Encounter: Payer: Self-pay | Admitting: Advanced Practice Midwife

## 2015-12-22 VITALS — BP 102/63 | HR 68 | Wt 158.3 lb

## 2015-12-22 DIAGNOSIS — IMO0001 Reserved for inherently not codable concepts without codable children: Secondary | ICD-10-CM

## 2015-12-22 DIAGNOSIS — Z3202 Encounter for pregnancy test, result negative: Secondary | ICD-10-CM

## 2015-12-22 DIAGNOSIS — Z30430 Encounter for insertion of intrauterine contraceptive device: Secondary | ICD-10-CM

## 2015-12-22 LAB — POCT PREGNANCY, URINE: Preg Test, Ur: NEGATIVE

## 2015-12-22 MED ORDER — LEVONORGESTREL 18.6 MCG/DAY IU IUD
INTRAUTERINE_SYSTEM | Freq: Once | INTRAUTERINE | Status: AC
  Administered 2015-12-22: 13:00:00 via INTRAUTERINE

## 2015-12-22 NOTE — Progress Notes (Signed)
Subjective:     Rebecca Ryan is a 25 y.o. female who presents for a postpartum visit. She is 7 weeks postpartum following a spontaneous vaginal delivery. I have fully reviewed the prenatal and intrapartum course. The delivery was at 49 gestational weeks. Outcome: spontaneous vaginal delivery. Anesthesia: epidural. Postpartum course has been unremarkable. Baby's course has been unremarkable. Baby is feeding by breast. Bleeding no bleeding. Bowel function is normal. Bladder function is normal. Patient is not sexually active. Contraception method is none. Postpartum depression screening: negative.  The following portions of the patient's history were reviewed and updated as appropriate: allergies, current medications, past family history, past medical history, past social history, past surgical history and problem list.  Review of Systems Pertinent items are noted in HPI.   Objective:    There were no vitals taken for this visit.  General:  alert, cooperative and no distress   Breasts:  inspection negative, no nipple discharge or bleeding, no masses or nodularity palpable  Lungs: clear to auscultation bilaterally  Heart:  regular rate and rhythm, S1, S2 normal, no murmur, click, rub or gallop  Abdomen: soft, non-tender; bowel sounds normal; no masses,  no organomegaly   Vulva:  normal  Vagina: normal vagina, no discharge, exudate, lesion, or erythema  Cervix:  multiparous appearance  Corpus: normal size, contour, position, consistency, mobility, non-tender  Adnexa:  no mass, fullness, tenderness  Rectal Exam: Not performed.        Patient identified, informed consent performed, signed copy in chart, time out was performed.  Urine pregnancy test negative.  Speculum placed in the vagina.  Cervix visualized.  Cleaned with Betadine x 2.  Grasped anteriourly with a single tooth tenaculum.  Uterus sounded to 5cm.  Mirena IUD placed per manufacturer's recommendations.  Strings trimmed to 3  cm.   Patient given post procedure instructions and [facility] care card with expiration date.  Patient is asked to check IUD strings periodically and follow up in 4-6 weeks for IUD check.  Assessment:     Normal postpartum exam. Pap smear not done at today's visit.    Pap done in January was normal  Plan:    1. Contraception: IUD 2.  Reviewed choices in contraception.  States she wants Mirena IUD Horticulturist, commercial) 3. Follow up in: 1 month for string check or as needed.

## 2015-12-22 NOTE — Patient Instructions (Signed)
Levonorgestrel intrauterine device (IUD) What is this medicine? LEVONORGESTREL IUD (LEE voe nor jes trel) is a contraceptive (birth control) device. The device is placed inside the uterus by a healthcare professional. It is used to prevent pregnancy and can also be used to treat heavy bleeding that occurs during your period. Depending on the device, it can be used for 3 to 5 years. This medicine may be used for other purposes; ask your health care provider or pharmacist if you have questions. What should I tell my health care provider before I take this medicine? They need to know if you have any of these conditions: -abnormal Pap smear -cancer of the breast, uterus, or cervix -diabetes -endometritis -genital or pelvic infection now or in the past -have more than one sexual partner or your partner has more than one partner -heart disease -history of an ectopic or tubal pregnancy -immune system problems -IUD in place -liver disease or tumor -problems with blood clots or take blood-thinners -use intravenous drugs -uterus of unusual shape -vaginal bleeding that has not been explained -an unusual or allergic reaction to levonorgestrel, other hormones, silicone, or polyethylene, medicines, foods, dyes, or preservatives -pregnant or trying to get pregnant -breast-feeding How should I use this medicine? This device is placed inside the uterus by a health care professional. Talk to your pediatrician regarding the use of this medicine in children. Special care may be needed. Overdosage: If you think you have taken too much of this medicine contact a poison control center or emergency room at once. NOTE: This medicine is only for you. Do not share this medicine with others. What if I miss a dose? This does not apply. What may interact with this medicine? Do not take this medicine with any of the following medications: -amprenavir -bosentan -fosamprenavir This medicine may also interact with  the following medications: -aprepitant -barbiturate medicines for inducing sleep or treating seizures -bexarotene -griseofulvin -medicines to treat seizures like carbamazepine, ethotoin, felbamate, oxcarbazepine, phenytoin, topiramate -modafinil -pioglitazone -rifabutin -rifampin -rifapentine -some medicines to treat HIV infection like atazanavir, indinavir, lopinavir, nelfinavir, tipranavir, ritonavir -St. John's wort -warfarin This list may not describe all possible interactions. Give your health care provider a list of all the medicines, herbs, non-prescription drugs, or dietary supplements you use. Also tell them if you smoke, drink alcohol, or use illegal drugs. Some items may interact with your medicine. What should I watch for while using this medicine? Visit your doctor or health care professional for regular check ups. See your doctor if you or your partner has sexual contact with others, becomes HIV positive, or gets a sexual transmitted disease. This product does not protect you against HIV infection (AIDS) or other sexually transmitted diseases. You can check the placement of the IUD yourself by reaching up to the top of your vagina with clean fingers to feel the threads. Do not pull on the threads. It is a good habit to check placement after each menstrual period. Call your doctor right away if you feel more of the IUD than just the threads or if you cannot feel the threads at all. The IUD may come out by itself. You may become pregnant if the device comes out. If you notice that the IUD has come out use a backup birth control method like condoms and call your health care provider. Using tampons will not change the position of the IUD and are okay to use during your period. What side effects may I notice from receiving this medicine?   Side effects that you should report to your doctor or health care professional as soon as possible: -allergic reactions like skin rash, itching or  hives, swelling of the face, lips, or tongue -fever, flu-like symptoms -genital sores -high blood pressure -no menstrual period for 6 weeks during use -pain, swelling, warmth in the leg -pelvic pain or tenderness -severe or sudden headache -signs of pregnancy -stomach cramping -sudden shortness of breath -trouble with balance, talking, or walking -unusual vaginal bleeding, discharge -yellowing of the eyes or skin Side effects that usually do not require medical attention (report to your doctor or health care professional if they continue or are bothersome): -acne -breast pain -change in sex drive or performance -changes in weight -cramping, dizziness, or faintness while the device is being inserted -headache -irregular menstrual bleeding within first 3 to 6 months of use -nausea This list may not describe all possible side effects. Call your doctor for medical advice about side effects. You may report side effects to FDA at 1-800-FDA-1088. Where should I keep my medicine? This does not apply. NOTE: This sheet is a summary. It may not cover all possible information. If you have questions about this medicine, talk to your doctor, pharmacist, or health care provider.    2016, [employer]vier/[employer] Standard. (2011-06-23 13:54:04)  

## 2015-12-22 NOTE — Addendum Note (Signed)
Addended by: Riccardo Dubin on: 12/22/2015 01:02 PM   Modules accepted: Orders

## 2016-01-06 ENCOUNTER — Encounter: Payer: Self-pay | Admitting: *Deleted

## 2016-01-27 ENCOUNTER — Encounter: Payer: Self-pay | Admitting: Advanced Practice Midwife

## 2016-01-27 ENCOUNTER — Ambulatory Visit: Payer: Medicaid Other | Admitting: Advanced Practice Midwife

## 2016-06-06 LAB — HM PAP SMEAR: PAP Smear, External: NORMAL

## 2020-11-17 LAB — HM PAP SMEAR: PAP Smear, External: NEGATIVE

## 2020-11-22 LAB — PAP IG, LIQUID-BASED CT-NG-TV, NAA, AND APTIMA HPV (199328)
.: 0
Chlamydia trachomatis, NAA: NEGATIVE
HPV Aptima: NEGATIVE
Neisseria Gonorrhoeae, NAA: NEGATIVE
Trichomonas Vaginalis by NAA: NEGATIVE

## 2020-12-14 LAB — CBC
Hematocrit: 34.5 % (ref 34.0–47.0)
Hemoglobin: 12.4 g/dL (ref 11.5–15.7)
MCH: 31.1 pg (ref 27.0–34.5)
MCHC: 35.9 g/dL (ref 32.0–36.0)
MCV: 86.5 fL (ref 81.0–99.0)
MPV: 10.7 fL (ref 7.2–13.2)
NRBC Absolute: 0 10*3/uL (ref 0.000–0.012)
NRBC Automated: 0 % (ref 0.0–0.2)
Platelets: 204 10*3/uL (ref 140–440)
RBC: 3.99 x10e6/mcL (ref 3.60–5.20)
RDW: 12.6 % (ref 11.0–16.0)
WBC: 11.7 10*3/uL — ABNORMAL HIGH (ref 3.8–10.6)

## 2020-12-14 LAB — RUBELLA ANTIBODY, IGG
Rubella IgG Scr Interp: REACTIVE
Rubella IgG Scr: 33 IU/mL

## 2020-12-14 LAB — HEMOGLOBIN A1C
Est. Avg. Glucose, WB: 100
Est. Avg. Glucose-calculated: 104
Hemoglobin A1C: 5.1 % (ref 4.0–6.0)

## 2020-12-14 LAB — HBSAG: Hepatitis B Surface Ag: NEGATIVE

## 2020-12-14 LAB — TSH WITH REFLEX TO FT4: TSH: 0.848 mcIU/mL (ref 0.358–3.740)

## 2020-12-15 LAB — ANTIBODY SCREEN: Antibody Screen: NEGATIVE

## 2020-12-15 LAB — ABO/RH: ABO/Rh: O POS

## 2020-12-15 LAB — ABO/RH RETYPE: ABO/RH Retype: O POS

## 2020-12-15 LAB — HIV SCREEN: HIV Screen: NEGATIVE

## 2020-12-15 LAB — CULTURE, URINE: FINAL REPORT: NO GROWTH

## 2020-12-16 LAB — TREPONEMA PALLIDUM (SYPHILLIS) ANTIBODY BY TP-PA: FLUORESCENT TREPONEMAL ANTIBODY: NONREACTIVE

## 2021-01-12 ENCOUNTER — Ambulatory Visit
Admit: 2021-01-12 | Discharge: 2021-01-12 | Payer: BLUE CROSS/BLUE SHIELD | Attending: Obstetrics & Gynecology | Primary: Family Medicine

## 2021-01-12 DIAGNOSIS — Z3A20 20 weeks gestation of pregnancy: Secondary | ICD-10-CM

## 2021-01-12 NOTE — Progress Notes (Signed)
[id number] at [redacted]w[redacted]d ROB. Round lig pain, declines genetics. Some racing heart rate, discussed hydration, rest and precautions. Anatomy NV    There are no problems to display for this patient.      Review of systems:  Review of Systems   Constitutional:  Negative for fever.   Cardiovascular:  Positive for palpitations.   Gastrointestinal:  Negative for abdominal pain.   Genitourinary:  Negative for vaginal bleeding.       Physical Exam  Physical Exam  Constitutional:       Appearance: Normal appearance.   Abdominal:      Palpations: Abdomen is soft.      Tenderness: There is no abdominal tenderness.      Comments: Gravid   Neurological:      Mental Status: She is alert.       Return for ROB. 4 weeks and anatomy    Belia Heman, MD

## 2021-01-12 NOTE — Progress Notes (Signed)
Some swelling feet   Some nausea and vomiting improving   Some L side lower abdominal pain   07/14 bleeding - called office did not go to ER  Feels like heart racing sometimes

## 2021-02-09 ENCOUNTER — Ambulatory Visit
Admit: 2021-02-09 | Discharge: 2021-02-09 | Payer: BLUE CROSS/BLUE SHIELD | Attending: Obstetrics & Gynecology | Primary: Family Medicine

## 2021-02-09 ENCOUNTER — Ambulatory Visit: Admit: 2021-02-09 | Discharge: 2021-02-09 | Payer: BLUE CROSS/BLUE SHIELD | Primary: Family Medicine

## 2021-02-09 DIAGNOSIS — Z3A20 20 weeks gestation of pregnancy: Secondary | ICD-10-CM

## 2021-02-09 DIAGNOSIS — Z3482 Encounter for supervision of other normal pregnancy, second trimester: Secondary | ICD-10-CM

## 2021-02-09 NOTE — Progress Notes (Signed)
Swelling feet / lower leg pain   Back pain     G3P2002 at [redacted]w[redacted]d ROB. Nml anatomy. +GERD> Discussed pepcid before dinner and tums.    There are no problems to display for this patient.      Review of systems:  Review of Systems      Physical Exam  Physical Exam    Return in about 4 weeks (around 03/09/2021) for ROB.    Belia Heman, MD

## 2021-03-09 ENCOUNTER — Ambulatory Visit
Admit: 2021-03-09 | Discharge: 2021-03-09 | Payer: BLUE CROSS/BLUE SHIELD | Attending: Obstetrics & Gynecology | Primary: Family Medicine

## 2021-03-09 DIAGNOSIS — Z3482 Encounter for supervision of other normal pregnancy, second trimester: Secondary | ICD-10-CM

## 2021-03-09 NOTE — Progress Notes (Signed)
Leg cramps/ swelling     G3P2002 at [redacted]w[redacted]d ROB. Leg cramps at night, feeling some pressure. Glucola ordered. Discussed magnesium and water intake    There are no problems to display for this patient.      Review of systems:  Review of Systems      Physical Exam  Physical Exam    Return in about 4 weeks (around 04/06/2021) for ROB.    Belia Heman, MD

## 2021-04-05 ENCOUNTER — Encounter

## 2021-04-05 LAB — CBC
Hematocrit: 32 % — ABNORMAL LOW (ref 34.0–47.0)
Hemoglobin: 11.1 g/dL — ABNORMAL LOW (ref 11.5–15.7)
MCH: 31 pg (ref 27.0–34.5)
MCHC: 34.7 g/dL (ref 32.0–36.0)
MCV: 89.4 fL (ref 81.0–99.0)
MPV: 10.6 fL (ref 7.2–13.2)
NRBC Absolute: 0 10*3/uL (ref 0.000–0.012)
NRBC Automated: 0 % (ref 0.0–0.2)
Platelets: 183 10*3/uL (ref 140–440)
RBC: 3.58 x10e6/mcL — ABNORMAL LOW (ref 3.60–5.20)
RDW: 14.3 % (ref 11.0–16.0)
WBC: 12.4 10*3/uL — ABNORMAL HIGH (ref 3.8–10.6)

## 2021-04-05 LAB — GLUCOSE 1 HOUR POST GLUCOLA OB: GTT-OB 1hr: 97 mg/dL (ref 50–135)

## 2021-04-06 ENCOUNTER — Ambulatory Visit
Admit: 2021-04-06 | Discharge: 2021-04-06 | Payer: BLUE CROSS/BLUE SHIELD | Attending: Obstetrics & Gynecology | Primary: Family Medicine

## 2021-04-06 DIAGNOSIS — Z3A28 28 weeks gestation of pregnancy: Secondary | ICD-10-CM

## 2021-04-06 NOTE — Progress Notes (Signed)
Leg cramps/ some feet swelling   Occasional belly tightness   Heartburn     Tired   G3P2002 at [redacted]w[redacted]d ROB. Some cramping, difficult to sleep. Declines flu shot. TDAP today.    There are no problems to display for this patient.      Review of systems:  Review of Systems      Physical Exam  Physical Exam    Return in about 2 weeks (around 04/20/2021) for ROB.    Belia Heman, MD

## 2021-04-06 NOTE — Addendum Note (Signed)
Addended by: Hulan Fray L on: 04/06/2021 03:26 PM     Modules accepted: Orders

## 2021-04-12 NOTE — Telephone Encounter (Signed)
Patient's husband called and said she is experiencing vomiting and issues swallowing. I asked him how far along she is he said he was unaware. I then asked of she was around and he said no. I told him the nurse may need more information but he said he would just like to know if she needs to come into the office. Please assist

## 2021-04-12 NOTE — Telephone Encounter (Signed)
Spoke with hhhhhhusband & encouraged him to take her to the er today; she agrees.

## 2021-04-19 ENCOUNTER — Ambulatory Visit
Admit: 2021-04-19 | Discharge: 2021-04-19 | Payer: BLUE CROSS/BLUE SHIELD | Attending: Obstetrics & Gynecology | Primary: Family Medicine

## 2021-04-19 DIAGNOSIS — Z3A30 30 weeks gestation of pregnancy: Secondary | ICD-10-CM

## 2021-04-19 NOTE — Progress Notes (Signed)
[id number] at [redacted]w[redacted]d ROB. Some cramping. switched to omeprazole and this helps somewhat. Discussed kick counts    There are no problems to display for this patient.      Review of systems:  Review of Systems      Physical Exam  Physical Exam    Return in about 2 weeks (around 05/03/2021) for ROB.    Belia Heman, MD

## 2021-04-19 NOTE — Progress Notes (Signed)
Coughing at night / throat hurts - taking acid reducer   Vomiting

## 2021-05-04 ENCOUNTER — Ambulatory Visit
Admit: 2021-05-04 | Discharge: 2021-05-04 | Payer: BLUE CROSS/BLUE SHIELD | Attending: Obstetrics & Gynecology | Primary: Family Medicine

## 2021-05-04 DIAGNOSIS — Z3A32 32 weeks gestation of pregnancy: Secondary | ICD-10-CM

## 2021-05-04 NOTE — Progress Notes (Signed)
[id number] at [redacted]w[redacted]d ROB. BH ctx, discussed labor, wants 39 wk IOL    There are no problems to display for this patient.      Review of systems:  Review of Systems      Physical Exam  Physical Exam    Return in about 2 weeks (around 05/18/2021) for ROB.    Belia Heman, MD

## 2021-05-18 ENCOUNTER — Encounter: Payer: BLUE CROSS/BLUE SHIELD | Attending: Obstetrics & Gynecology | Primary: Family Medicine

## 2021-05-27 NOTE — Telephone Encounter (Signed)
Pt husband called stating she has seen a little water and blood and would like to know what to do?

## 2021-05-27 NOTE — Telephone Encounter (Signed)
RETURNED CALL, NO ANSWER, LEFT VM LETTING PT TO PRESENT TO OBED FOR WATER LEAKING, BLOOD OR CALL BACK BEFORE 430 PM TO DISCUSS.

## 2021-05-28 ENCOUNTER — Inpatient Hospital Stay
Admit: 2021-05-28 | Discharge: 2021-05-28 | Disposition: A | Discharge status: Home / Self Care | Payer: BLUE CROSS/BLUE SHIELD

## 2021-05-28 DIAGNOSIS — O26893 Other specified pregnancy related conditions, third trimester: Secondary | ICD-10-CM

## 2021-05-28 DIAGNOSIS — N898 Other specified noninflammatory disorders of vagina: Secondary | ICD-10-CM

## 2021-05-28 LAB — URINALYSIS W/ RFLX MICROSCOPIC
Bilirubin Urine: NEGATIVE
Blood, Urine: NEGATIVE
Glucose, UA: NEGATIVE
Ketones, Urine: NEGATIVE
Nitrite, Urine: NEGATIVE
Protein, UA: NEGATIVE
Specific Gravity, UA: 1.01 (ref 1.003–1.035)
Urobilinogen, Urine: 0.2 EU/dL
pH, UA: 7 (ref 4.5–8.0)

## 2021-05-28 LAB — CULTURE, STREP B SCREEN, VAGINAL/RECTAL

## 2021-05-28 LAB — MICROSCOPIC URINALYSIS

## 2021-05-28 NOTE — ED Provider Notes (Addendum)
This 30 year old gravida 3 para 2 presents at 35-4/7 weeks with complaint of mucousy discharge with some pink streaking noted this morning.  She denies any bleeding or spotting otherwise.  She denies any leaking of fluid.  She has good fetal movement.  Patient relates she has been having 2-3 contractions per day but no more than that.  She is a [facility] she denies any complications in the pregnancy, she has had 2 prior spontaneous vaginal deliveries without complications    Patient requests being checked by the nurses that she is Muslim    Vaginal Discharge  Pertinent negatives include no abdominal pain, chest pain, congestion, coughing, fever, nausea or sore throat.      Chief Complaint   Patient presents with    Vaginal Discharge       No past medical history on file.  Patient denies    No past surgical history on file.  Patient denies      No family history on file.  Patient denies pertinent family history    Social History     Socioeconomic History    Marital status: Married     Spouse name: Not on file    Number of children: Not on file    Years of education: Not on file    Highest education level: Not on file   Occupational History    Not on file   Tobacco Use    Smoking status: Never    Smokeless tobacco: Never    Tobacco comments:     11/17/2020   Substance and Sexual Activity    Alcohol use: Never    Drug use: Never    Sexual activity: Yes     Partners: Male   Other Topics Concern    Not on file   Social History Narrative    Not on file     Social Determinants of Health     Financial Resource Strain: Not on file   Food Insecurity: Not on file   Transportation Needs: Not on file   Physical Activity: Not on file   Stress: Not on file   Social Connections: Not on file   Intimate Partner Violence: Not on file   Housing Stability: Not on file     Husband acted as interpreter    ALLERGIES: Patient has no known allergies.    Review of Systems   Constitutional:  Negative for  activity change, appetite change and fever.   HENT:  Negative for congestion and sore throat.    Eyes: Negative.    Respiratory:  Negative for cough and shortness of breath.    Cardiovascular: Negative.  Negative for chest pain and palpitations.   Gastrointestinal:  Negative for abdominal pain and nausea.   Endocrine: Negative.    Genitourinary:  Positive for vaginal discharge. Negative for difficulty urinating and pelvic pain.   Musculoskeletal: Negative.    Allergic/Immunologic: Negative.    Neurological:  Negative for dizziness.   Hematological:  Does not bruise/bleed easily.   Psychiatric/Behavioral: Negative.       Vitals:    05/28/21 0915   BP: 119/76   Pulse: 86   Resp: 18   Temp: 98.3 ??F (36.8 ??C)   TempSrc: Oral   SpO2: 100%            Physical Exam  Vitals and nursing note reviewed. Exam conducted with a chaperone present.   Constitutional:       Appearance: Normal  appearance.   HENT:      Head: Normocephalic.      Nose: Nose normal.      Mouth/Throat:      Mouth: Mucous membranes are moist.   Eyes:      Comments: Grossly normal   Cardiovascular:      Rate and Rhythm: Normal rate and regular rhythm.   Pulmonary:      Effort: Pulmonary effort is normal.   Abdominal:      Palpations: Abdomen is soft.      Tenderness: There is no abdominal tenderness. There is no guarding or rebound.      Comments: Gravid consistent with dating, fundus nontender positive fetal movement noted on palpation  Fetal heart tones are category 1 with good acceleration of beat-to-beat variability  No contractions noted per toco   Genitourinary:     Comments: Sterile vaginal exam per nursing showed cervix closed thick and ballotable  No bleeding noted on glove  Musculoskeletal:         General: No tenderness.      Cervical back: Normal range of motion and neck supple.      Right lower leg: No edema.      Left lower leg: No edema.   Skin:     General: Skin is warm and dry.   Neurological:      General: No focal deficit present.       Mental Status: She is alert and oriented to person, place, and time.   Psychiatric:         Mood and Affect: Mood normal.         Behavior: Behavior normal.        MDM               Procedures    Impression  IUP 35-4/7 weeks  Vaginal discharge probable mucous plug  Rare contraction    Unknown GBS status    Plan  Extended monitoring  Urinalysis  GBS culture    Addendum    Urinalysis unremarkable  GBS pending  Fetal heart tones remain category 1 with good acceleration beat-to-beat variability  No contractions noted per toco    Will discharge home  Kick counts and labor precautions reviewed  Follow-up in office is scheduled

## 2021-05-28 NOTE — Other (Signed)
Husband translating. Unable to question pt alone at this time

## 2021-05-28 NOTE — ED Triage Notes (Signed)
Pt reports thick mucous discharge this am and occasional ctx 2-3 times per day. She reports + fm

## 2021-06-01 ENCOUNTER — Encounter: Payer: BLUE CROSS/BLUE SHIELD | Attending: Obstetrics & Gynecology | Primary: Family Medicine

## 2021-06-06 NOTE — L&D Delivery Note (Signed)
Ob-gyn Operative Note    Pt: Marie Wolf    MRN: [id number]    Date of service: 06/09/2021    Preoperative dx: 30yo G3P2002 at [redacted]w[redacted]d PROM     Postoperative dx: same    Procedure: Spontaneous Vaginal Delivery    Surgeon: Flo Shanks, MD    Anesthesia: Epidural     EBL: 200cc    Findings: live vigorous female infant, OA, clear fluid, no lacerations     Specimens: Placenta and cord blood     APGAR: pending     Birthweight: pending    Complications: None     Indication: IOL for PROM at [redacted]w[redacted]d     Description of procedure: She was noted to be complete and began to push. She went on to deliver a vigorous female infant in the occiput anterior position. Delivery was via spontaneous vaginal delivery. A loose nuchal cord X1 was easily reduced. Following delivery of the head, the shoulders and torso followed without difficulty. After delaying cord clamping for 60 seconds, the cord was doubly clamped and cut. The infant was handed off to the awaiting nursery staff. APGARs pending. Weight was pending. Pitocin was started IV and the placenta delivered. It was intact with a three-vessel cord and trailing membranes. Cord blood was drawn and sent. Massage of the abdomen revealed the fundus to be firm with Pitocin. Inspection of the perineum revealed no lacerations. No cervical lacerations noted. Re-inspection of the perineum revealed excellent hemostasis. Mother and infant remained in her room postpartum in stable condition. Sponge, lap, and needle counts were correct X2. The placenta was not sent to pathology.     Electronically signed by Flo Shanks, MD on 06/09/2021 at 12:02 AM      Mother's Information      Labor Events      Cervical Ripening:   Now               Marie Wolf, Baby Pending Basya [[id number]]      Labor Events      Cervical Ripening Date/Time:       Rupture Date/Time:            Anesthesia           Start Pushing      Labor onset date/time:   Now     Dilation complete date/time:   Now     Start pushing  date/time:    Decision date/time (emergent c-section):           Delivery (Newborn)      Delivery Date/Time:         C-Section Details:            Shoulder Dystocia    Add Second Maneuver  Add Third Maneuver  Add Fourth Maneuver  Add Fifth Maneuver  Add Sixth Maneuver  Add Seventh Maneuver  Add Eighth Maneuver  Add Ninth Maneuver       Assisted Delivery Details           Document Additional Attempt         Document Additional Attempt                 Cord           Placenta           Lacerations           Vaginal Counts        Sponges Needles Instruments   Initial Counts  Final Counts      If the count is incorrect due to Intentionally Retained Foreign Object (IRFO) add the IRFO LDA in Lines/Drains.  Add LDA: Link to [facility]       Blood Loss  Mother: Marie Wolf, Marie Wolf #[id number]     Start of Mother's Information      Delivery Blood Loss  06/08/21 1202 - 06/09/21 0002      None                 End of Mother's Information  Mother: Marie Wolf, Marie Wolf #[id number]                Delivery Providers    Delivering clinician:              Newborn Assessment       Apgar Scoring Key:    0 1 2    Skin Color: Blue or pale Acrocyanotic Completely pink    Heart Rate: Absent <100 bpm >100 bpm    Reflex Irritability: No response Grimace Cry or active withdrawal    Muscle Tone: Limp Some flexion Active motion    Respiratory Effort: Absent Weak cry; hypoventilation Good, crying                      Skin Color:   Heart Rate:   Reflex Irritability:   Muscle Tone:   Respiratory Effort:   Total:            1 Minute:        Apgar 1 total from OB History    5 Minute:        Apgar 5 total from OB History    10 Minute:              15 Minute:              20 Minute:                                 Resuscitation               Newborn Measurements               Title      Skin to Skin Initiation Date/Time:       Skin to Skin End Date/Time:

## 2021-06-08 ENCOUNTER — Encounter: Attending: Obstetrics & Gynecology | Primary: Family Medicine

## 2021-06-08 ENCOUNTER — Inpatient Hospital Stay
Admission: EM | Admit: 2021-06-08 | Discharge: 2021-06-10 | Disposition: A | Discharge status: Home / Self Care | Payer: BLUE CROSS/BLUE SHIELD | Source: Other Acute Inpatient Hospital | Admitting: Obstetrics & Gynecology

## 2021-06-08 DIAGNOSIS — O4202 Full-term premature rupture of membranes, onset of labor within 24 hours of rupture: Principal | ICD-10-CM

## 2021-06-08 LAB — CBC
Hematocrit: 33.8 % — ABNORMAL LOW (ref 34.0–47.0)
Hemoglobin: 11.7 g/dL (ref 11.5–15.7)
MCH: 31.6 pg (ref 27.0–34.5)
MCHC: 34.6 g/dL (ref 32.0–36.0)
MCV: 91.4 fL (ref 81.0–99.0)
MPV: 10.5 fL (ref 7.2–13.2)
NRBC Absolute: 0 10*3/uL (ref 0.000–0.012)
NRBC Automated: 0 % (ref 0.0–0.2)
Platelets: 203 10*3/uL (ref 140–440)
RBC: 3.7 x10e6/mcL (ref 3.60–5.20)
RDW: 14.4 % (ref 11.0–16.0)
WBC: 16.1 10*3/uL — ABNORMAL HIGH (ref 3.8–10.6)

## 2021-06-08 LAB — URINALYSIS
Bilirubin Urine: NEGATIVE
Glucose, UA: NEGATIVE mg/dL
Ketones, Urine: NEGATIVE mg/dL
Leukocyte Esterase, Urine: NEGATIVE
Nitrite, Urine: NEGATIVE
Protein, UA: NEGATIVE
Specific Gravity, UA: <=1.005 (ref 1.003–1.035)
Urobilinogen, Urine: 0.2 EU/dL
pH, UA: 7 (ref 4.5–8.0)

## 2021-06-08 LAB — ANTIBODY SCREEN: Antibody Screen: NEGATIVE

## 2021-06-08 LAB — ABO/RH: ABO/Rh: O POS

## 2021-06-08 MED ORDER — CARBOPROST TROMETHAMINE 250 MCG/ML IM SOLN
250 MCG/ML | INTRAMUSCULAR | Status: AC | PRN
Start: 2021-06-08 — End: 2021-06-09

## 2021-06-08 MED ORDER — DOCUSATE SODIUM 100 MG PO CAPS
100 MG | Freq: Two times a day (BID) | ORAL | Status: AC
Start: 2021-06-08 — End: 2021-06-09

## 2021-06-08 MED ORDER — WITCH HAZEL-GLYCERIN EX PADS
CUTANEOUS | Status: AC | PRN
Start: 2021-06-08 — End: 2021-06-09

## 2021-06-08 MED ORDER — SODIUM CHLORIDE 0.9 % IV SOLN (MINI-BAG)
0.9 % | INTRAVENOUS | Status: AC
Start: 2021-06-08 — End: 2021-06-09
  Administered 2021-06-09 (×2): 1000 mg via INTRAVENOUS

## 2021-06-08 MED ORDER — NORMAL SALINE FLUSH 0.9 % IV SOLN
0.9 % | Freq: Two times a day (BID) | INTRAVENOUS | Status: AC
Start: 2021-06-08 — End: 2021-06-09

## 2021-06-08 MED ORDER — NALBUPHINE HCL 10 MG/ML IJ SOLN
10 MG/ML | INTRAMUSCULAR | Status: DC | PRN
Start: 2021-06-08 — End: 2021-06-09

## 2021-06-08 MED ORDER — MISOPROSTOL 200 MCG PO TABS
200 MCG | ORAL | Status: DC | PRN
Start: 2021-06-08 — End: 2021-06-09

## 2021-06-08 MED ORDER — HYDROCORTISONE 2.5 % EX CREA
2.5 % | CUTANEOUS | Status: AC | PRN
Start: 2021-06-08 — End: 2021-06-09

## 2021-06-08 MED ORDER — ACETAMINOPHEN 325 MG PO TABS
325 | ORAL | Status: DC | PRN
Start: 2021-06-08 — End: 2021-06-09

## 2021-06-08 MED ORDER — TRANEXAMIC ACID 1000 MG/10ML IV SOLN
1000 MG/10ML | Freq: Once | INTRAVENOUS | Status: DC | PRN
Start: 2021-06-08 — End: 2021-06-09

## 2021-06-08 MED ORDER — METHYLERGONOVINE MALEATE 0.2 MG/ML IJ SOLN
0.2 MG/ML | INTRAMUSCULAR | Status: AC | PRN
Start: 2021-06-08 — End: 2021-06-09

## 2021-06-08 MED ORDER — BENZOCAINE-MENTHOL 20-0.5 % EX AERO
20-0.5 % | CUTANEOUS | Status: AC | PRN
Start: 2021-06-08 — End: 2021-06-09

## 2021-06-08 MED ORDER — SODIUM CHLORIDE 0.9 % IV SOLN (MINI-BAG)
0.9 % | Freq: Once | INTRAVENOUS | Status: AC
Start: 2021-06-08 — End: 2021-06-08
  Administered 2021-06-08: 20:00:00 2000 mg via INTRAVENOUS

## 2021-06-08 MED ORDER — DEXTROSE IN LACTATED RINGERS 5 % IV SOLN
5 % | INTRAVENOUS | Status: DC
Start: 2021-06-08 — End: 2021-06-10
  Administered 2021-06-08: 22:00:00 125 via INTRAVENOUS
  Administered 2021-06-08: 20:00:00 via INTRAVENOUS

## 2021-06-08 MED ORDER — OXYTOCIN 30 UNITS IN 500 ML INFUSION
30500 UNIT/500ML | INTRAVENOUS | Status: AC
Start: 2021-06-08 — End: 2021-06-10
  Administered 2021-06-08: 22:00:00 4 m[IU]/min via INTRAVENOUS

## 2021-06-08 MED ORDER — SODIUM CHLORIDE 0.9 % IV SOLN
0.9 % | INTRAVENOUS | Status: AC | PRN
Start: 2021-06-08 — End: 2021-06-09

## 2021-06-08 MED ORDER — NORMAL SALINE FLUSH 0.9 % IV SOLN
0.9 % | INTRAVENOUS | Status: AC | PRN
Start: 2021-06-08 — End: 2021-06-09

## 2021-06-08 MED ORDER — ONDANSETRON HCL 4 MG/2ML IJ SOLN
42 MG/2ML | Freq: Four times a day (QID) | INTRAMUSCULAR | Status: AC | PRN
Start: 2021-06-08 — End: 2021-06-09

## 2021-06-08 MED FILL — AMPICILLIN SODIUM 1 G IJ SOLR: 1 g | INTRAMUSCULAR | Qty: 1000

## 2021-06-08 MED FILL — AMPICILLIN SODIUM 2 G IJ SOLR: 2 g | INTRAMUSCULAR | Qty: 2000

## 2021-06-08 MED FILL — OXYTOCIN-SODIUM CHLORIDE 30-0.9 UT/500ML-% IV SOLN: INTRAVENOUS | Qty: 500

## 2021-06-08 NOTE — Anesthesia Pre-Procedure Evaluation (Signed)
Department of Anesthesiology  Preprocedure Note       Name:  Marie Wolf   Age:  31 y.o.  DOB:  23-Jun-1990                                          MRN:  [id number]         Date:  06/08/2021      Surgeon: * No surgeons listed *    Procedure: * No procedures listed *    Medications prior to admission:   Prior to Admission medications    Medication Sig Start Date End Date Taking? Authorizing Provider   OMEPRAZOLE PO Take by mouth    Historical Provider, MD   Famotidine (PEPCID PO) Take by mouth  Patient not taking: No sig reported    Historical Provider, MD   Prenatal Vit w/Fe-Methylfol-FA (PNV PO) PNV    Historical Provider, MD       Current medications:    Current Facility-Administered Medications   Medication Dose Route Frequency Provider Last Rate Last Admin   ??? oxytocin (PITOCIN) 30 units in 500 mL infusion  1-20 milli-units/min IntraVENous Continuous Troy Sine, MD 30 mL/hr at 06/08/21 2155 30 milli-units/min at 06/08/21 2155   ??? dextrose 5 % in lactated ringers infusion   IntraVENous Continuous Troy Sine, MD 125 mL/hr at 06/08/21 1638 125 mL/hr at 06/08/21 1638   ??? sodium chloride flush 0.9 % injection 5-40 mL  5-40 mL IntraVENous 2 times per day Troy Sine, MD       ??? sodium chloride flush 0.9 % injection 5-40 mL  5-40 mL IntraVENous PRN Troy Sine, MD       ??? 0.9 % sodium chloride infusion  25 mL IntraVENous PRN Troy Sine, MD       ??? nalbuphine (NUBAIN) injection 5 mg  5 mg IntraVENous Q2H PRN Troy Sine, MD       ??? ondansetron [facility]) injection 4 mg  4 mg IntraVENous Q6H PRN Troy Sine, MD       ??? methylergonovine (METHERGINE) injection 200 mcg  200 mcg IntraMUSCular PRN Troy Sine, MD       ??? carboprost (HEMABATE) injection 250 mcg  250 mcg IntraMUSCular PRN Troy Sine, MD       ??? miSOPROStol (CYTOTEC) tablet 800 mcg  800 mcg Rectal PRN Troy Sine, MD       ??? tranexamic  acid (CYKLOKAPRON) 1,000 mg in sodium chloride 0.9 % 110 mL IVPB (mini-bag)  1,000 mg IntraVENous Once PRN Troy Sine, MD       ??? acetaminophen (TYLENOL) tablet 650 mg  650 mg Oral Q4H PRN Troy Sine, MD       ??? witch hazel-glycerin (TUCKS) pad   Topical PRN Troy Sine, MD       ??? hydrocortisone 2.5 % cream   Topical Q2H PRN Troy Sine, MD       ??? benzocaine-menthol (DERMOPLAST) 20-0.5 % spray   Topical PRN Troy Sine, MD       ??? docusate sodium (COLACE) capsule 100 mg  100 mg Oral BID Troy Sine, MD       ??? ampicillin (OMNIPEN) 1,000 mg in sodium chloride 0.9 % 100 mL IVPB (mini-bag)  1,000 mg IntraVENous Q4H Troy Sine, MD  Stopped at 06/08/21 1938   ??? lactated ringers infusion   IntraVENous Once Flo Shanks, MD       ??? fentanyl 2 mcg/mL and bupivacaine 0.125% in 0.9% sodium chloride 100 mL (OB) epidural                Allergies:  No Known Allergies    Problem List:    Patient Active Problem List   Diagnosis Code   ??? PROM (premature rupture of membranes) O42.90       Past Medical History:  History reviewed. No pertinent past medical history.    Past Surgical History:  History reviewed. No pertinent surgical history.    Social History:    Social History     Tobacco Use   ??? Smoking status: Never   ??? Smokeless tobacco: Never   ??? Tobacco comments:     11/17/2020   Substance Use Topics   ??? Alcohol use: Never                                Counseling given: Not Answered  Tobacco comments: 11/17/2020      Vital Signs (Current):   Vitals:    06/08/21 1900 06/08/21 1911 06/08/21 1916 06/08/21 1930   BP: 110/60  118/65 115/71   Pulse: 84  84 91   Resp:  18     Temp:  36.8 ??C (98.3 ??F)     TempSrc:  Oral     SpO2:       Weight:                                                  BP Readings from Last 3 Encounters:   06/08/21 115/71   05/28/21 119/76   05/04/21 (!) 95/55       NPO Status:                                                                                  BMI:   Wt Readings from Last 3 Encounters:   06/08/21 185 lb (83.9 kg)   05/28/21 184 lb 12.8 oz (83.8 kg)   05/04/21 186 lb 6 oz (84.5 kg)     Body mass index is 29.86 kg/m??.    CBC:   Lab Results   Component Value Date/Time    WBC 16.1 06/08/2021 02:45 PM    RBC 3.70 06/08/2021 02:45 PM    HGB 11.7 06/08/2021 02:45 PM    HCT 33.8 06/08/2021 02:45 PM    MCV 91.4 06/08/2021 02:45 PM    RDW 14.4 06/08/2021 02:45 PM    PLT 203 06/08/2021 02:45 PM       CMP: No results found for: NA, K, CL, CO2, BUN, CREATININE, GFRAA, AGRATIO, LABGLOM, GLUCOSE, GLU, PROT, CALCIUM, BILITOT, ALKPHOS, AST, ALT    POC Tests: No results for input(s): POCGLU, POCNA, POCK, POCCL, POCBUN, POCHEMO, POCHCT in the last 72 hours.    Coags: No results found for: PROTIME,  INR, APTT    HCG (If Applicable): No results found for: PREGTESTUR, PREGSERUM, HCG, HCGQUANT     ABGs: No results found for: PHART, PO2ART, PCO2ART, HCO3ART, BEART, O2SATART     Type & Screen (If Applicable):  No results found for: LABABO, LABRH    Drug/Infectious Status (If Applicable):  No results found for: HIV, HEPCAB    COVID-19 Screening (If Applicable): No results found for: COVID19        Anesthesia Evaluation  Patient summary reviewed and Nursing notes reviewed  Airway: Mallampati: III          Dental: normal exam         Pulmonary:Negative Pulmonary ROS and normal exam                               Cardiovascular:Negative CV ROS                      Neuro/Psych:   Negative Neuro/Psych ROS              GI/Hepatic/Renal: Neg GI/Hepatic/Renal ROS            Endo/Other: Negative Endo/Other ROS                    Abdominal:             Vascular: negative vascular ROS.         Other Findings:           Anesthesia Plan      epidural     ASA 2             Anesthetic plan and risks discussed with patient and spouse.                        Shara Blazingrish Kimberly Rakeya Glab, APRN - CRNA   06/08/2021

## 2021-06-08 NOTE — Telephone Encounter (Signed)
Pt husband called and stated that patient water broke and she's now bleeding. Advised spouse to take patient to [facility] L&D. He verbalized understanding.

## 2021-06-08 NOTE — Progress Notes (Signed)
Labor Note    Patient resting comfortably in chair. No complaints. SVE unchanged at 4/50/-2. Cat I FHT. Pitocin at 20. Continue to increase up to maximum of 30.

## 2021-06-08 NOTE — ED Provider Notes (Signed)
OB ED Provider Note    Name: Marie Wolf MRN: [id number]     Date of Birth: 1990/08/16  Age: 31 y.o.  Sex: female      Subjective:     Reason for Presentation to [facility] ED:  vaginal spotting    History of Present Illness: Ms. Marie Wolf is a 31 y.o. G3P2 at [redacted]w[redacted]d gestation with Estimated Date of Delivery: 06/28/21. Her current obstetrical history is significant for uncomplicated pregnancy. Patient of Dr. Elray Buba Prenatal records reviewed. GBS pos    She reports good fetal movement. She reports a small amount of vaginal bleeding. She reports minimal leakage of fluid. She reports regular contractions.     OB History   Gravida Para Term Preterm AB Living   3 2 2     2    SAB IAB Ectopic Molar Multiple Live Births             2      # Outcome Date GA Lbr Len/2nd Weight Sex Delivery Anes PTL Lv   3 Current            2 Term 12/16/17    M Vag-Spont   LIV   1 Term 10/31/15   7 lb (3.175 kg) F Vag-Spont   LIV     History reviewed. No pertinent past medical history.  History reviewed. No pertinent surgical history.  Social History     Occupational History    Not on file   Tobacco Use    Smoking status: Never    Smokeless tobacco: Never    Tobacco comments:     11/17/2020   Vaping Use    Vaping Use: Never used   Substance and Sexual Activity    Alcohol use: Never    Drug use: Never    Sexual activity: Yes     Partners: Male        No Known Allergies  Prior to Admission medications    Medication Sig Start Date End Date Taking? Authorizing Provider   OMEPRAZOLE PO Take by mouth    Historical Provider, MD   Famotidine (PEPCID PO) Take by mouth  Patient not taking: No sig reported    Historical Provider, MD   Prenatal Vit w/Fe-Methylfol-FA (PNV PO) PNV    Historical Provider, MD          Objective:     Physical Exam:  VITALS:  Temp 98.1 ??F (36.7 ??C) (Oral)    Resp 17    Wt 185 lb (83.9 kg)    LMP 09/29/2020    SpO2 99%    BMI 29.86 kg/m??       CONSTITUTIONAL:  awake, alert, cooperative, no apparent distress, and appears  stated age  LUNGS:  No increased work of breathing, good air exchange, clear to auscultation bilaterally, no crackles or wheezing  CARDIOVASCULAR:  Normal apical impulse, regular rate and rhythm, normal S1 and S2, no S3 or S4, and no murmur noted  ABDOMEN:  No scars, normal bowel sounds, soft, non-distended, non-tender, no masses palpated, no hepatosplenomegally and gravid  NEUROLOGIC:  Awake, alert, oriented to name, place and time.  Cranial nerves II-XII are grossly intact.  Motor is 5 out of 5 bilaterally.  Cerebellar finger to nose, heel to shin intact.  Sensory is intact.  Babinski down going, Romberg negative, and gait is normal.  SKIN:  normal skin color, texture, turgor  FHTs: Baseline: 140s bpm, Variability: Good {> 6 bpm), Accelerations: Reactive, Decelerations: Absent,  and Category I strip  Uterine activity/Contractions: Regular, every 2-3 minutes  Membranes: intact  Cervix: 2-3 cm dilated, 50% effacement, -2 station  Presentation: cephalic    Lab/Data Review & Summary:  ROM plus had too much mucous unable to run.    Limited Bedside ultrasound shows fetus in cephalic presentation with AFI 4    Assessment:  [redacted]w[redacted]d gestation  Prom - having contractions but in early labor  Reassuring fetal status  GBS pos    Plan:  admit for labor induction with pitocin    Ampicillin for GBS pos  Epidural if desires  Dr. Alen Bleacher to accept care of patient. Discussed case with her.

## 2021-06-08 NOTE — Telephone Encounter (Signed)
Mess left as follow up.

## 2021-06-08 NOTE — Anesthesia Procedure Notes (Signed)
Epidural Block    Patient location during procedure: OB  Start time: 06/08/2021 11:03 PM  End time: 06/08/2021 11:16 PM  Reason for block: labor epidural  Staffing  Performed: resident/CRNA   Resident/CRNA: Shara Blazing, APRN - CRNA  Epidural  Patient position: sitting  Prep: ChloraPrep and site prepped and draped  Patient monitoring: continuous pulse ox and frequent blood pressure checks  Approach: midline  Location: L3-4  Injection technique: LOR saline  Provider prep: mask and sterile gloves  Needle  Needle type: Tuohy   Needle gauge: 17 G  Needle length: 3.5 in  Catheter type: multi-orifice  Catheter size: 19 G  Catheter at skin depth: 14 cm  Test dose: negativeCatheter Secured: tegaderm and tape  Assessment  Hemodynamics: stable  Attempts: 1  Outcomes: uncomplicated and patient tolerated procedure well  Preanesthetic Checklist  Completed: patient identified, IV checked, risks and benefits discussed, surgical/procedural consents, equipment checked, pre-op evaluation, timeout performed, anesthesia consent given, oxygen available, monitors applied/VS acknowledged, fire risk safety assessment completed and verbalized and blood product R/B/A discussed and consented

## 2021-06-08 NOTE — H&P (Signed)
H&P    06/08/2021    4:50 PM    CC: Induction of labor for PROM    HPI: This is a 31 y.o. [id number]  with an intrauterine pregnancy at [redacted]w[redacted]d . Prenatal care with Dr. Elray Buba. Pregnancy uncomplicated. She presents to the hospital for leakage of fluid. Findings in OBED consistent with PROM. Minimal vaginal bleeding. Endorses good fetal movement and regular contractions.     ROS: all systems were reviewed and were otherwise negative except as stated above.    Ob-gynHx:   OB History       Gravida   3    Para   2    Term   2    Preterm        AB        Living   2         SAB        IAB        Ectopic        Molar        Multiple        Live Births   2                PMH: History reviewed. No pertinent past medical history.    PSH: History reviewed. No pertinent surgical history.    Pfmhx: History reviewed. No pertinent family history.    Socialhx:   Social History     Socioeconomic History    Marital status: Married     Spouse name: Not on file    Number of children: Not on file    Years of education: Not on file    Highest education level: Not on file   Occupational History    Not on file   Tobacco Use    Smoking status: Never    Smokeless tobacco: Never    Tobacco comments:     11/17/2020   Vaping Use    Vaping Use: Never used   Substance and Sexual Activity    Alcohol use: Never    Drug use: Never    Sexual activity: Yes     Partners: Male   Other Topics Concern    Not on file   Social History Narrative    Not on file     Social Determinants of Health     Financial Resource Strain: Not on file   Food Insecurity: Not on file   Transportation Needs: Not on file   Physical Activity: Not on file   Stress: Not on file   Social Connections: Not on file   Intimate Partner Violence: Not on file   Housing Stability: Not on file       Medications:   Prior to Admission medications    Medication Sig Start Date End Date Taking? Authorizing Provider   OMEPRAZOLE PO Take by mouth    Historical Provider, MD   Famotidine (PEPCID PO) Take  by mouth  Patient not taking: No sig reported    Historical Provider, MD   Prenatal Vit w/Fe-Methylfol-FA (PNV PO) PNV    Historical Provider, MD       Allergies: No Known Allergies    BP 107/61    Pulse 81    Temp 98 ??F (36.7 ??C)    Resp 18    Wt 185 lb (83.9 kg)    LMP 09/29/2020    SpO2 99%    BMI 29.86 kg/m??     Physical Exam:    General:  NAD  HEENT: NCAT  Neuro: AAOx3  CV: warm and well perfused  Pul: normal work of breathing on room air  Ab: gravid, soft, non-ttp  Ext: trace edema  MS: full rom all ext  Psyc:mood/affect appropriate  SVE: 4/50/-2, Pelvis adequate, cephalic     EFW ~5956L     FHT: 140 bpm, moderate variability, negative accel, negative decel  TOCO: ctx q 2-65min    Prenatal Labs:    Blood type: O positive  Antibody: Negative  Rubella:Immune  Hep B: negative  Hep C: negative  HIV: negative  Glucola: 97  GBS: positive    A&P: Pt is a 31 y.o. [id number]  with an intrauterine pregnancy at [redacted]w[redacted]d admitted for IOL for PROM. Pregnancy otherwise uncomplicated     - Admit to L&D for IOL   - Cat I FHT  - SVE 4/50/-2. Continue pitocin.   - GBS positive. Continue ampicillin  - Clear liquid diet  - Anesthesia notified  - SCDs for VTE prophylaxis    Signed: Flo Shanks, MD

## 2021-06-09 MED ORDER — OXYTOCIN 30 UNITS IN 500 ML INFUSION
30 UNIT/500ML | INTRAVENOUS | Status: DC | PRN
Start: 2021-06-09 — End: 2021-06-10

## 2021-06-09 MED ORDER — SODIUM CHLORIDE (PF) 0.9 % IJ SOLN
0.9 % | INTRAMUSCULAR | Status: DC | PRN
Start: 2021-06-09 — End: 2021-06-09

## 2021-06-09 MED ORDER — WITCH HAZEL-GLYCERIN EX PADS
CUTANEOUS | Status: DC | PRN
Start: 2021-06-09 — End: 2021-06-10

## 2021-06-09 MED ORDER — EPHEDRINE SULFATE 50 MG/ML IV SOLN
50 MG/ML | Freq: Two times a day (BID) | INTRAVENOUS | Status: AC | PRN
Start: 2021-06-09 — End: 2021-06-09

## 2021-06-09 MED ORDER — DOCUSATE SODIUM 100 MG PO CAPS
100 MG | Freq: Two times a day (BID) | ORAL | Status: DC
Start: 2021-06-09 — End: 2021-06-10
  Administered 2021-06-09 – 2021-06-10 (×3): 100 mg via ORAL

## 2021-06-09 MED ORDER — LACTATED RINGERS IV BOLUS
INTRAVENOUS | Status: DC | PRN
Start: 2021-06-09 — End: 2021-06-09

## 2021-06-09 MED ORDER — CARBOPROST TROMETHAMINE 250 MCG/ML IM SOLN
250 MCG/ML | INTRAMUSCULAR | Status: DC | PRN
Start: 2021-06-09 — End: 2021-06-10

## 2021-06-09 MED ORDER — ACETAMINOPHEN 500 MG PO TABS
500 MG | Freq: Three times a day (TID) | ORAL | Status: DC
Start: 2021-06-09 — End: 2021-06-10
  Administered 2021-06-09 – 2021-06-10 (×4): 1000 mg via ORAL

## 2021-06-09 MED ORDER — HYDROCORTISONE 2.5 % EX CREA
2.5 % | CUTANEOUS | Status: DC | PRN
Start: 2021-06-09 — End: 2021-06-10

## 2021-06-09 MED ORDER — EPHEDRINE SULFATE (PRESSORS) 50 MG/ML IV SOLN
50 | INTRAVENOUS | Status: DC | PRN
Start: 2021-06-09 — End: 2021-06-09

## 2021-06-09 MED ORDER — SIMETHICONE 80 MG PO CHEW
80 MG | Freq: Four times a day (QID) | ORAL | Status: DC | PRN
Start: 2021-06-09 — End: 2021-06-10

## 2021-06-09 MED ORDER — IBUPROFEN 800 MG PO TABS
800 MG | Freq: Three times a day (TID) | ORAL | Status: DC
Start: 2021-06-09 — End: 2021-06-10
  Administered 2021-06-09 – 2021-06-10 (×4): 800 mg via ORAL

## 2021-06-09 MED ORDER — LANOLIN EX OINT
CUTANEOUS | Status: DC | PRN
Start: 2021-06-09 — End: 2021-06-10

## 2021-06-09 MED ORDER — TETANUS-DIPHTH-ACELL PERTUSSIS 5-2.5-18.5 LF-MCG/0.5 IM SUSY
5-2.5-18.5-0.5 LF-MCG/0.5 | INTRAMUSCULAR | Status: DC
Start: 2021-06-09 — End: 2021-06-10

## 2021-06-09 MED ORDER — LACTATED RINGERS IV SOLN
Freq: Once | INTRAVENOUS | Status: DC
Start: 2021-06-09 — End: 2021-06-09

## 2021-06-09 MED ORDER — FENTANYL AND BUPIVACAINE (OB) EPIDURAL 100 ML
Status: AC
Start: 2021-06-09 — End: 2021-06-09
  Administered 2021-06-09: 04:00:00 12 via EPIDURAL

## 2021-06-09 MED ORDER — METHYLERGONOVINE MALEATE 0.2 MG/ML IJ SOLN
0.2 MG/ML | INTRAMUSCULAR | Status: DC | PRN
Start: 2021-06-09 — End: 2021-06-10

## 2021-06-09 MED ORDER — SODIUM CHLORIDE 0.9 % IV SOLN
0.9 % | INTRAVENOUS | Status: DC | PRN
Start: 2021-06-09 — End: 2021-06-10

## 2021-06-09 MED ORDER — LIDOCAINE-EPINEPHRINE 1.5 %-1:200000 IJ SOLN
1.5 %-1:200000 | INTRAMUSCULAR | Status: AC | PRN
Start: 2021-06-09 — End: 2021-06-09
  Administered 2021-06-09: 04:00:00 3 via EPIDURAL

## 2021-06-09 MED ORDER — MISOPROSTOL 200 MCG PO TABS
200 MCG | ORAL | Status: DC | PRN
Start: 2021-06-09 — End: 2021-06-10

## 2021-06-09 MED ORDER — MISOPROSTOL 200 MCG PO TABS
200 | ORAL | Status: DC | PRN
Start: 2021-06-09 — End: 2021-06-10

## 2021-06-09 MED ORDER — NORMAL SALINE FLUSH 0.9 % IV SOLN
0.9 % | INTRAVENOUS | Status: DC | PRN
Start: 2021-06-09 — End: 2021-06-10

## 2021-06-09 MED ORDER — FENTANYL AND BUPIVACAINE (OB) EPIDURAL 100 ML
Status: AC
Start: 2021-06-09 — End: 2021-06-08

## 2021-06-09 MED ORDER — OXYTOCIN 0.06 UNIT/ML BOLUS FROM THE BAG (POST-PARTUM)
30 UNIT/500ML | INTRAVENOUS | Status: DC | PRN
Start: 2021-06-09 — End: 2021-06-10

## 2021-06-09 MED ORDER — BENZOCAINE-MENTHOL 20-0.5 % EX AERO
CUTANEOUS | Status: DC | PRN
Start: 2021-06-09 — End: 2021-06-10

## 2021-06-09 MED ORDER — ONDANSETRON HCL 4 MG/2ML IJ SOLN
4 MG/2ML | Freq: Four times a day (QID) | INTRAMUSCULAR | Status: DC | PRN
Start: 2021-06-09 — End: 2021-06-10

## 2021-06-09 MED ORDER — NORMAL SALINE FLUSH 0.9 % IV SOLN
0.9 % | Freq: Two times a day (BID) | INTRAVENOUS | Status: DC
Start: 2021-06-09 — End: 2021-06-10

## 2021-06-09 MED ORDER — PHENYLEPHRINE HCL 1 MG/10ML IV SOSY
1 | INTRAVENOUS | Status: DC | PRN
Start: 2021-06-09 — End: 2021-06-09

## 2021-06-09 MED ORDER — FAMOTIDINE 20 MG PO TABS
20 MG | Freq: Two times a day (BID) | ORAL | Status: DC | PRN
Start: 2021-06-09 — End: 2021-06-10

## 2021-06-09 MED ORDER — DEXTROSE IN LACTATED RINGERS 5 % IV SOLN
5 % | INTRAVENOUS | Status: DC
Start: 2021-06-09 — End: 2021-06-10

## 2021-06-09 MED ORDER — BISACODYL 10 MG RE SUPP
10 MG | Freq: Every day | RECTAL | Status: DC | PRN
Start: 2021-06-09 — End: 2021-06-10

## 2021-06-09 MED FILL — SM PAIN RELIEVER EX ST 500 MG PO TABS: 500 MG | ORAL | Qty: 2

## 2021-06-09 MED FILL — IBUPROFEN 800 MG PO TABS: 800 MG | ORAL | Qty: 1

## 2021-06-09 MED FILL — FENTANYL AND BUPIVACAINE (OB) EPIDURAL 100 ML: Qty: 100

## 2021-06-09 MED FILL — DOCUSATE SODIUM 100 MG PO CAPS: 100 MG | ORAL | Qty: 1

## 2021-06-09 MED FILL — AMPICILLIN SODIUM 1 G IJ SOLR: 1 g | INTRAMUSCULAR | Qty: 1000

## 2021-06-09 NOTE — Anesthesia Post-Procedure Evaluation (Signed)
Department of Anesthesiology  Postprocedure Note    Patient: Marie Wolf  MRN: [id number]  Birthdate: 1990-11-21  Date of evaluation: 06/09/2021      Procedure Summary     Date: 06/08/21 Room / Location:     Anesthesia Start: 2303 Anesthesia Stop: 2348    Procedure: Labor Analgesia Diagnosis:     Scheduled Providers:  Responsible Provider: Shara Blazing, APRN - CRNA    Anesthesia Type: Epidural ASA Status: 2          Anesthesia Type: Epidural    Aldrete Phase I:      Aldrete Phase II:        Anesthesia Post Evaluation    Patient location during evaluation: bedside  Patient participation: complete - patient participated  Level of consciousness: awake and alert  Pain score: 0  Nausea & Vomiting: no nausea and no vomiting  Complications: no  Cardiovascular status: blood pressure returned to baseline  Respiratory status: acceptable  Hydration status: stable  Multimodal analgesia pain management approach

## 2021-06-09 NOTE — Progress Notes (Signed)
Department of Obstetrics and Gynecology  Labor and Delivery  Post Partum Progress Note      SUBJECTIVE:      31 y.o. [id number] PPD#1 s/p NSVD at 2348. MBT O POS  . GBS positive.   Doing well. Ambulating, tolerating PO. +flatus  Pain controlled. Not voiding, straight cath at 4 am.  Breast Feeding  Lochia light.    OBJECTIVE:      Vitals:  Visit Vitals  BP 104/66   Pulse 70   Temp 98.4 ??F (36.9 ??C) (Oral)   Resp 16   Wt 185 lb (83.9 kg)   SpO2 98%   Breastfeeding Unknown   BMI 29.86 kg/m??         Gen: A&O x 3, NAD  Pulm: CTA-B  Card: RRR, No MRG  Abd: +BSx4, NT, Uterus firm  Extremities: +1 pitting edema LE, no erythema    DATA:       Nait Ssi Lahcen, Baby Girl Alisyn [[id number]]      Events of Labor    Preterm labor?: No  Antenatal steroids?: None  Cervical ripening date/time:     Antibiotics received during labor?: Yes  Rupture date/time: 06/08/21 1145   Rupture type: SROM  Fluid color: Clear  Fluid odor: None  Induction: None  Augmentation: Oxytocin  Labor complications: None                      All lab results for the last 24 hours reviewed.  CBC:    Recent Labs     06/08/21  1445   WBC 16.1*   HGB 11.7   HCT 33.8*   PLT 203        ASSESSMENT & PLAN:      Principal Problem:    PROM (premature rupture of membranes)      Continue routine pp orders. Monitor urine output. Nurse aware.   Anticipate d/c tomorrow.

## 2021-06-10 MED ORDER — IBUPROFEN 800 MG PO TABS
800 MG | ORAL_TABLET | Freq: Three times a day (TID) | ORAL | 0 refills | Status: AC | PRN
Start: 2021-06-10 — End: ?

## 2021-06-10 MED FILL — DOCUSATE SODIUM 100 MG PO CAPS: 100 MG | ORAL | Qty: 1

## 2021-06-10 MED FILL — SM PAIN RELIEVER EX ST 500 MG PO TABS: 500 MG | ORAL | Qty: 2

## 2021-06-10 MED FILL — IBUPROFEN 800 MG PO TABS: 800 MG | ORAL | Qty: 1

## 2021-06-10 NOTE — Discharge Summary (Signed)
Obstetrical Discharge Form/SVD Progress Note Day #2    Gestational Age: [redacted]w[redacted]d    Antepartum complications: none    Delivery: SVD #2, GBS pos       Baby:   Nait Ssi Lahcen, Baby Girl Cambelle [[id number]]      Events of Labor    Preterm labor?: No  Antenatal steroids?: None  Cervical ripening date/time:     Antibiotics received during labor?: Yes  Rupture date/time: 06/08/21 1145   Rupture type: SROM  Fluid color: Clear  Fluid odor: None  Induction: None  Augmentation: Oxytocin  Labor complications: None                        Information for the patient's newborn:  Nait Ssi Elenor Legato Girl Jaiyla [[id number]]   APGAR One: 8   Information for the patient's newborn:  Nait Ssi Elenor Legato Girl Shanette [[id number]]   APGAR Five: 9   Information for the patient's newborn:  Nait Ssi Elenor Legato Girl Orvella [[id number]]   Birth Weight: 6 lb 12 oz (3.062 kg)     Intrapartum complications: None   Nait Ssi Lahcen, Baby Girl Britzy [[id number]]      Events of Labor    Preterm labor?: No  Antenatal steroids?: None  Cervical ripening date/time:     Antibiotics received during labor?: Yes  Rupture date/time: 06/08/21 1145   Rupture type: SROM  Fluid color: Clear  Fluid odor: None  Induction: None  Augmentation: Oxytocin  Labor complications: None                     Postpartum complications: none    HPI: 31 y.o. [id number] PPD# 2 s/p nsvd. Meeting all goal and ready for discharge. Ambulating, tolerating PO and voiding with no issues.   Breast Feeding  Lochia small.      Vitals: Visit Vitals  BP 101/69   Pulse 70   Temp 97.9 ??F (36.6 ??C) (Oral)   Resp 16   Wt 185 lb (83.9 kg)   SpO2 98%   Breastfeeding Unknown   BMI 29.86 kg/m??        PE:   GEN: alert and oriented x3, NAD  Pulm: CTA-B  Cards: RRR, no MRG  Abd: Soft, NTND, Uterus firm 2 below umbilicus  Extremities: trace edema, warm and dry    Discharge Medication:      Medication List        START taking these medications      ibuprofen 800 MG tablet  Commonly known as: ADVIL;MOTRIN  Take 1  tablet by mouth every 8 hours as needed for Pain            CONTINUE taking these medications      OMEPRAZOLE PO     PNV PO            STOP taking these medications      PEPCID PO               Where to Get Your Medications        These medications were sent to [facility] [address], [place] - [address] - [phone] Carmon Ginsberg [phone]  [address], [place] [address]      Phone: [phone]   ibuprofen 800 MG tablet         Discharge Condition:  stable    Discharge Date: 06/10/21     PLAN:  Principal Problem:   SVD Day #2, Doing well, ready for dc home. PP instructions and rx reviewed.      Follow up in 6 weeks for routine PP visit  All questions answered

## 2021-06-15 ENCOUNTER — Encounter: Attending: Obstetrics & Gynecology | Primary: Family Medicine

## 2021-06-22 ENCOUNTER — Encounter: Attending: Obstetrics & Gynecology | Primary: Family Medicine

## 2021-09-18 ENCOUNTER — Ambulatory Visit: Admit: 2021-09-18 | Discharge: 2021-09-18 | Payer: BLUE CROSS/BLUE SHIELD | Attending: Family

## 2021-09-18 DIAGNOSIS — J029 Acute pharyngitis, unspecified: Secondary | ICD-10-CM

## 2021-09-18 LAB — POC COVID-19 & INFLUENZA COMBO (LIAT IN HOUSE)
INFLUENZA A: NOT DETECTED
INFLUENZA B: NOT DETECTED
SARS-CoV-2: NOT DETECTED

## 2021-09-18 MED ORDER — AMOXICILLIN 500 MG PO CAPS
500 MG | ORAL_CAPSULE | Freq: Every day | ORAL | 0 refills | Status: AC
Start: 2021-09-18 — End: 2021-09-28

## 2021-09-18 NOTE — Progress Notes (Signed)
Marie Wolf (DOB:  [birth year]) is a 31 y.o. female, here for evaluation of the following chief complaint(s):  Chief Complaint   Patient presents with    Generalized Body Aches    Pharyngitis     X 1 day    Excessive Sweating     Night sweats       SUBJECTIVE/OBJECTIVE:    HPI:    Presents with body aches, sore throat, fever and chills which started yesterday. Throat pain is aggravated by swallowing.  States son has strep throat. She tried ibuprofen with short term relief. Denies cough, shortness of breath.      Current Outpatient Medications on File Prior to Visit   Medication Sig Dispense Refill    ibuprofen (ADVIL;MOTRIN) 800 MG tablet Take 1 tablet by mouth every 8 hours as needed for Pain 30 tablet 0    Prenatal Vit w/Fe-Methylfol-FA (PNV PO) PNV       No current facility-administered medications on file prior to visit.      No Known Allergies      History reviewed. No pertinent past medical history.   No family history on file.   Social History     Socioeconomic History    Marital status: Married     Spouse name: None    Number of children: None    Years of education: None    Highest education level: None   Tobacco Use    Smoking status: Never    Smokeless tobacco: Never    Tobacco comments:     11/17/2020   Vaping Use    Vaping Use: Never used   Substance and Sexual Activity    Alcohol use: Never    Drug use: Never    Sexual activity: Yes     Partners: Male      Review of Systems   Constitutional:  Positive for chills. Negative for fever.   HENT:  Positive for rhinorrhea and sore throat.    Respiratory:  Negative for cough.    Cardiovascular: Negative.    All other systems reviewed and are negative.        VITALS    BP 106/72 (Site: Right Upper Arm)   Pulse 98   Temp 99.1 F (37.3 C) (Oral)   Resp 18   Ht 5\' 5"  (1.651 m)   Wt 163 lb 11.2 oz (74.3 kg)   SpO2 99%   Breastfeeding Yes   BMI 27.24 kg/m     Physical Exam  Constitutional:       General: She is not in acute distress.      Appearance: She is well-developed.   HENT:      Right Ear: Tympanic membrane normal.      Left Ear: Tympanic membrane normal.      Nose: Rhinorrhea present.      Mouth/Throat:      Mouth: Mucous membranes are moist.      Pharynx: Posterior oropharyngeal erythema present.      Tonsils: 2+ on the right. 2+ on the left.      Comments: PND  Cardiovascular:      Rate and Rhythm: Normal rate and regular rhythm.   Pulmonary:      Effort: Pulmonary effort is normal.      Breath sounds: Normal breath sounds.   Lymphadenopathy:      Cervical: Cervical adenopathy present.   Skin:     General: Skin is warm and dry.   Neurological:  Mental Status: She is alert.           ASSESSMENT/PLAN:  1. Sore throat  -     POC COVID-19 & Influenza Combo ([facility])  -     POC Strep A Assay w/Optic (00762)  2. Body aches  3. Acute pharyngitis, unspecified etiology  -     amoxicillin (AMOXIL) 500 MG capsule; Take 2 capsules by mouth daily for 10 days, Disp-20 capsule, R-0Normal  4. Cervical lymphadenopathy  5. Fever in adult     Testing is negative for Covid, Flu A and Flu B today. Continue to monitor for worsening symptoms. Based on symptoms and exam, will treat clinically as strep throat. Advised to take antibiotics until course is complete. May use tylenol or ibuprofen as needed for body aches, fever. Instructed to discard and replace toothbrush on day 3 of treatment. Follow up with PCP or RTC if no improvement.     Orders Placed This Encounter   Medications    amoxicillin (AMOXIL) 500 MG capsule     Sig: Take 2 capsules by mouth daily for 10 days     Dispense:  20 capsule     Refill:  0        Results for orders placed or performed in visit on 09/18/21   POC COVID-19 & Influenza Combo ([facility] in House)   Result Value Ref Range    SARS-CoV-2 Not Detected Not Detected    INFLUENZA A Not Detected Not Detected    INFLUENZA B Not Detected Not Detected           FOLLOWUP  Return if symptoms worsen or fail to improve.              An  electronic signature was used to authenticate this note.    --Ander Gaster, APRN - NP

## 2021-10-01 ENCOUNTER — Ambulatory Visit: Admit: 2021-10-01 | Discharge: 2021-10-01 | Payer: BLUE CROSS/BLUE SHIELD | Attending: Medical

## 2021-10-01 DIAGNOSIS — N61 Mastitis without abscess: Secondary | ICD-10-CM

## 2021-10-01 MED ORDER — CEPHALEXIN 500 MG PO CAPS
500 MG | ORAL_CAPSULE | Freq: Four times a day (QID) | ORAL | 0 refills | Status: AC
Start: 2021-10-01 — End: 2021-10-08

## 2021-10-01 NOTE — Telephone Encounter (Signed)
Pt husband called stating pt breast is red and swollen and wanted to know if she can be seen today or tomorrow and does she have to come to OB/GYN?

## 2021-10-01 NOTE — Telephone Encounter (Signed)
Pt is breast feeding 18 month old; has mastitis sx; will go to express care today.

## 2021-10-01 NOTE — Progress Notes (Signed)
Marie Wolf (DOB:  20-Oct-1990) is a 31 y.o. female, here for evaluation of the following chief complaint(s):  Breast Pain (Right breast and swelling patient is currently breastfeeding ), Fever, and Chills         ASSESSMENT/PLAN:  1. Mastitis, left, acute  -     cephALEXin (KEFLEX) 500 MG capsule; Take 1 capsule by mouth 4 times daily for 7 days, Disp-28 capsule, R-0Normal    Concern for mastitis at this time so will start treatment with Keflex, reviewed supportive therapies and s/sx to monitor, encourage fluids and rest, recommend to continue feeding on both sides, keep close F/U with OB for recheck, otherwise RTC as needed. Patient/husband acknowledges understanding and is in agreement with plan, patient left clinic in stable condition      No results found for any visits on 10/01/21.     Return if symptoms worsen or fail to improve.         Subjective   SUBJECTIVE/OBJECTIVE:  HPI  Patient presents to clinic with husband for complaint of redness, swelling, and pain to the left breast x couple days. She is breastfeeding (gave birth early January) and whenever the baby feeds on the left side it is very painful so she has stopped feeding and has only been pumping occasionally on that side. Tried calling her OB today, but has not heard back from them yet so came here instead. Worried for an infection. Denies fevers, N/V. Of note, she just finished a round of Amoxicillin about 3 days ago for strep throat. However, she later admits that she did not complete the entire course of Amoxicillin, stopped it several days early.     No Known Allergies     Current Outpatient Medications   Medication Sig Dispense Refill    cephALEXin (KEFLEX) 500 MG capsule Take 1 capsule by mouth 4 times daily for 7 days 28 capsule 0    ibuprofen (ADVIL;MOTRIN) 800 MG tablet Take 1 tablet by mouth every 8 hours as needed for Pain 30 tablet 0    Prenatal Vit w/Fe-Methylfol-FA (PNV PO) PNV (Patient not taking: Reported on 10/01/2021)        No current facility-administered medications for this visit.        History reviewed. No pertinent past medical history.     History reviewed. No pertinent surgical history.     Social History     Tobacco Use    Smoking status: Never     Passive exposure: Never    Smokeless tobacco: Never    Tobacco comments:     11/17/2020   Vaping Use    Vaping Use: Never used   Substance Use Topics    Alcohol use: Never    Drug use: Never        Review of Systems  Other than what was mentioned in HPI, ROS is negative            Objective   BP 111/76 (Site: Right Upper Arm, Position: Sitting, Cuff Size: Medium Adult)   Pulse 63   Temp 98.1 F (36.7 C) (Oral)   Resp 16   Ht 5\' 5"  (1.651 m)   Wt 160 lb 6.4 oz (72.8 kg)   SpO2 98%   BMI 26.69 kg/m    Physical Exam  Constitutional:       General: She is not in acute distress.     Appearance: She is not ill-appearing, toxic-appearing or diaphoretic.   HENT:  Head: Normocephalic and atraumatic.   Pulmonary:      Effort: Pulmonary effort is normal. No respiratory distress.   Chest:   Breasts:     Left: No inverted nipple, mass or nipple discharge.          Comments: Left breast: small area of erythema, warmth, and slight induration noted medial aspect, tender, no fluctuance  Neurological:      Mental Status: She is alert and oriented to person, place, and time.      Gait: Gait normal.                  An electronic signature was used to authenticate this note.    --Hessie Dibble, PA-C

## 2021-12-17 ENCOUNTER — Ambulatory Visit: Admit: 2021-12-17 | Discharge: 2021-12-17 | Payer: BLUE CROSS/BLUE SHIELD | Attending: Family Medicine

## 2021-12-17 ENCOUNTER — Encounter

## 2021-12-17 DIAGNOSIS — N61 Mastitis without abscess: Secondary | ICD-10-CM

## 2021-12-17 DIAGNOSIS — I959 Hypotension, unspecified: Secondary | ICD-10-CM

## 2021-12-17 LAB — POC COVID-19 & INFLUENZA COMBO (LIAT IN HOUSE)
INFLUENZA A: NOT DETECTED
INFLUENZA B: NOT DETECTED
SARS-CoV-2: NOT DETECTED

## 2021-12-17 LAB — AMB POC URINE PREGNANCY TEST, VISUAL COLOR COMPARISON: HCG, Pregnancy, Urine, POC: NEGATIVE

## 2021-12-17 LAB — AMB POC URINALYSIS DIP STICK AUTO W/O MICRO
Bilirubin, Urine, POC: NEGATIVE
Glucose, Urine, POC: NEGATIVE
Ketones, Urine, POC: NEGATIVE
Leukocyte Esterase, Urine, POC: NEGATIVE
Nitrite, Urine, POC: NEGATIVE
Protein, Urine, POC: NEGATIVE
Specific Gravity, Urine, POC: 1.01 (ref 1.001–1.035)
Urobilinogen, POC: NORMAL
pH, Urine, POC: 5.5 (ref 4.6–8.0)

## 2021-12-17 MED ORDER — AMOXICILLIN-POT CLAVULANATE 875-125 MG PO TABS
875-125 MG | ORAL_TABLET | Freq: Two times a day (BID) | ORAL | 0 refills | Status: DC
Start: 2021-12-17 — End: 2021-12-17

## 2021-12-17 MED ORDER — ACETAMINOPHEN 325 MG PO TABS
325 MG | Freq: Once | ORAL | Status: AC
Start: 2021-12-17 — End: 2021-12-17
  Administered 2021-12-17: 23:00:00 975 mg via ORAL

## 2021-12-17 NOTE — Progress Notes (Signed)
VENIPUNCTURE PERFORMED    Verbal consent obtained. 23G butterfly Needle used. Site Rt arm. Site cleaned with aseptic technique. Pressure applied to site. Patient tolerated well. 1 tubes collected.

## 2021-12-17 NOTE — Progress Notes (Signed)
Sherman Nait Ssi Junie Panning is a 31 y.o. female presents today with   Chief Complaint   Patient presents with    Breast Pain    Back Pain    Dizziness    Diarrhea    Headache     Pt symptoms x 1 week.. Taking Tylenol for symptoms.     Other     bodyaches    .      Breast Pain  Associated Symptoms: breast pain    Back Pain  Associated symptoms include a fever and headaches. Pertinent negatives include no chest pain.   Dizziness  Associated symptoms: diarrhea, fever and headaches    Associated symptoms: no chest pain, no congestion, no cough, no nausea, no rash, no rhinorrhea, no shortness of breath, no sore throat, no vomiting and no wheezing    Diarrhea   Associated symptoms include a fever and headaches. Pertinent negatives include no coughing or vomiting.   Headache  Other  Associated symptoms: diarrhea, fever and headaches    Associated symptoms: no chest pain, no congestion, no cough, no nausea, no rash, no rhinorrhea, no shortness of breath, no sore throat, no vomiting and no wheezing   this is a 31 year old female who comes in today with fever and body aches for the past week.  No one else at home has similar symptoms.  She is accompanied by her family and her husband is interpreting for her.  We have offered them interpretive services today and they have declined.  Patient has bilateral breast tenderness.  She has a 70-month-old that she has been breast-feeding.  She has had some difficulty this week due to the severity of her symptoms.  She has been diagnosed with mastitis in the past.  She reports needlelike pain in both breasts with the right one being worse.  She has also had low back pain, decreased appetite and some mild diarrhea.  She has been eating less this week but has been able to drink water.  She states that her appetite has been decreased and food taste different to her.  She has not had any nasal congestion or coughing.  She has been taking Tylenol for her symptoms but has not had any  today.    Current Outpatient Medications   Medication Sig Dispense Refill    ibuprofen (ADVIL;MOTRIN) 800 MG tablet Take 1 tablet by mouth every 8 hours as needed for Pain (Patient not taking: Reported on 12/17/2021) 30 tablet 0    Prenatal Vit w/Fe-Methylfol-FA (PNV PO) PNV (Patient not taking: Reported on 10/01/2021)       No current facility-administered medications for this visit.        No Known Allergies     History reviewed. No pertinent past medical history.     History reviewed. No pertinent surgical history.     Social History     Socioeconomic History    Marital status: Married     Spouse name: Not on file    Number of children: Not on file    Years of education: Not on file    Highest education level: Not on file   Occupational History    Not on file   Tobacco Use    Smoking status: Never     Passive exposure: Never    Smokeless tobacco: Never    Tobacco comments:     11/17/2020   Vaping Use    Vaping Use: Never used   Substance and Sexual Activity  Alcohol use: Never    Drug use: Never    Sexual activity: Yes     Partners: Male   Other Topics Concern    Not on file   Social History Narrative    Not on file     Social Determinants of Health     Financial Resource Strain: Not on file   Food Insecurity: Not on file   Transportation Needs: Not on file   Physical Activity: Not on file   Stress: Not on file   Social Connections: Not on file   Intimate Partner Violence: Not on file   Housing Stability: Not on file        Review of Systems   Constitutional:  Positive for activity change, appetite change and fever.   HENT:  Negative for congestion, rhinorrhea, sinus pain and sore throat.    Respiratory:  Negative for cough, shortness of breath and wheezing.    Cardiovascular:  Negative for chest pain.   Gastrointestinal:  Positive for diarrhea. Negative for nausea and vomiting.   Musculoskeletal:  Positive for back pain.   Skin:  Negative for color change and rash.   Neurological:  Positive for dizziness and  headaches.      BP (!) 84/52 (Site: Right Upper Arm, Position: Sitting, Cuff Size: Medium Adult)   Pulse (!) 115   Temp 99 F (37.2 C) (Oral)   Resp 24   Ht 5\' 4"  (1.626 m)   Wt 157 lb 6.4 oz (71.4 kg)   SpO2 95%   BMI 27.02 kg/m      Physical Exam  Vitals and nursing note reviewed.   Constitutional:       General: She is not in acute distress.     Appearance: Normal appearance. She is normal weight. She is ill-appearing. She is not toxic-appearing or diaphoretic.   HENT:      Head: Normocephalic and atraumatic.      Nose: Nose normal.      Mouth/Throat:      Mouth: Mucous membranes are moist.      Pharynx: Posterior oropharyngeal erythema present. No oropharyngeal exudate.   Eyes:      Conjunctiva/sclera: Conjunctivae normal.      Pupils: Pupils are equal, round, and reactive to light.   Cardiovascular:      Rate and Rhythm: Regular rhythm. Tachycardia present.      Pulses: Normal pulses.      Heart sounds: Normal heart sounds.   Pulmonary:      Effort: Pulmonary effort is normal. No respiratory distress.      Breath sounds: Normal breath sounds. No wheezing.   Chest:   Breasts:     Right: Nipple discharge and tenderness present. No swelling, bleeding, inverted nipple, mass or skin change.      Left: Nipple discharge and tenderness present. No swelling, bleeding, inverted nipple, mass or skin change.   Abdominal:      Tenderness: There is no abdominal tenderness.   Musculoskeletal:      Cervical back: Normal range of motion and neck supple.   Lymphadenopathy:      Cervical: No cervical adenopathy.      Upper Body:      Right upper body: No axillary adenopathy.      Left upper body: No axillary adenopathy.   Skin:     General: Skin is warm and dry.      Findings: No rash.   Neurological:      General: No focal  deficit present.      Mental Status: She is alert and oriented to person, place, and time.   Psychiatric:         Mood and Affect: Mood normal.        1. Mastitis  -     Cbc With Auto Differential;  Future  2. Fever, unspecified fever cause  -     acetaminophen (TYLENOL) tablet 975 mg; 975 mg (rounded from 1,000 mg), Oral, ONCE, 1 dose, On Fri 12/17/21 at dose of acetaminophen is 4000 mg from all sources in 24 hours.  -     AMB POC URINALYSIS DIP STICK AUTO W/O MICRO  -     POC COVID-19 & Influenza Combo ([facility] in House)  -     Culture, Urine; Future  -     Cbc With Auto Differential; Future  3. Mastalgia  4. Low back pain without sciatica, unspecified back pain laterality, unspecified chronicity  -     AMB POC URINE PREGNANCY TEST, VISUAL COLOR COMPARISON  -     AMB POC URINALYSIS DIP STICK AUTO W/O MICRO  -     Culture, Urine; Future       Visit Diagnoses         Codes    Mastitis    -  Primary N61.0    Fever, unspecified fever cause     R50.9    Mastalgia     N64.4    Low back pain without sciatica, unspecified back pain laterality, unspecified chronicity     M54.50             Results for orders placed or performed in visit on 12/17/21   AMB POC URINE PREGNANCY TEST, VISUAL COLOR COMPARISON   Result Value Ref Range    Valid Internal Control, POC VERIFIED     HCG, Pregnancy, Urine, POC Negative Negative   AMB POC URINALYSIS DIP STICK AUTO W/O MICRO   Result Value Ref Range    Color (UA POC) Yellow     Clarity (UA POC) Clear     Glucose, Urine, POC Negative Negative    Bilirubin, Urine, POC Negative Negative    Ketones, Urine, POC Negative Negative    Specific Gravity, Urine, POC 1.010 1.001 - 1.035    Blood (UA POC) Small Negative    pH, Urine, POC 5.5 4.6 - 8.0    Protein, Urine, POC Negative Negative    Urobilinogen, POC Normal     Nitrite, Urine, POC Negative Negative    Leukocyte Esterase, Urine, POC Negative Negative   POC COVID-19 & Influenza Combo ([facility] in House)   Result Value Ref Range    SARS-CoV-2 Not Detected Not Detected    INFLUENZA A Not Detected Not Detected    INFLUENZA B Not Detected Not Detected    Narrative    Is this test for diagnosis or screening?->Diagnosis of ill  patient  Symptomatic for COVID-19 as defined by CDC?->Yes  Date of Symptom Onset->12/12/21  Hospitalized for COVID-19?->No  Admitted to ICU for COVID-19?->No  Employed in healthcare setting?->No  Resident in a congregate (group) care setting?->No  Pregnant?->Unknown  Previously tested for COVID-19?->Unknown     Tested negative for COVID and flu.  Urinalysis was unremarkable and pregnancy test negative.  A stat CBC was collected and sent to the lab however I will not get results back till tomorrow.  Patient was given 975 mg of Tylenol while we ran her chest today.  A repeat  of her vital signs showed that her temperature was down to 99 and her heart rate was down to 115 but also her blood pressure had dropped to 84/52 and was rechecked manually.  Patient reports overall feeling little bit better but very tired.  I discussed the concern with the low blood pressure and concern for possible sepsis and recommended further evaluation in the emergency room.  Husband has declined EMS transport.  He states he will take her to [facility] ER.  I did call report to [place] over at [facility] ER.    Return today (on 12/17/2021) for taken pov to ER, report called to [place] at [facility].     Barbee Shropshire, DO

## 2021-12-17 NOTE — ED Provider Notes (Signed)
[facility] EMERGENCY DEPT  EMERGENCY DEPARTMENT ENCOUNTER      Pt Name: Marie Wolf  MRN: [id number]  Birthdate 08-31-1990  Date of evaluation: 12/17/2021  Provider: Vanice Sarah, PA-C    CHIEF COMPLAINT       Chief Complaint   Patient presents with    Breast Pain     Pt breastfeeding and having breast pain and possible infection.  Sent from urgent care.         HISTORY OF PRESENT ILLNESS   (Location/Symptom, Timing/Onset, Context/Setting, Quality, Duration, Modifying Factors, Severity)  Note limiting factors.   Marie Wolf is a 31 y.o. female who presents to the emergency department      31 y/o female with c/o right breast pain x 1 week.  Patient is 6 months post-partum and is breastfeeding.  She developed right breast pain 1 week ago.  Her pain worsened today and developed fever. She reports h/o mastitis in the past and believes these symptoms were similar.  She was seen in an urgent care facility, but was noted to be febrile, tachycardic, and hypotensive. They provided her with Tylenol and advised to come to the ER for evaluation of possible sepsis.  She denies CP, cough, SOB, abdominal pain, n/v/d, or urinary symptoms.     The history is provided by the patient and the spouse.     Nursing Notes were reviewed.    REVIEW OF SYSTEMS    (2-9 systems for level 4, 10 or more for level 5)     Review of Systems   Constitutional:  Positive for fever.   HENT: Negative.     Respiratory: Negative.     Cardiovascular:  Positive for chest pain (right breast pain).   Gastrointestinal: Negative.    Musculoskeletal: Negative.    Psychiatric/Behavioral: Negative.       Except as noted above the remainder of the review of systems was reviewed and negative.       PAST MEDICAL HISTORY   No past medical history on file.      SURGICAL HISTORY     No past surgical history on file.      CURRENT MEDICATIONS       Discharge Medication List as of 12/17/2021 11:24 PM        CONTINUE these medications which have NOT CHANGED     Details   !! ibuprofen (ADVIL;MOTRIN) 800 MG tablet Take 1 tablet by mouth every 8 hours as needed for Pain, Disp-30 tablet, R-0Normal      Prenatal Vit w/Fe-Methylfol-FA (PNV PO) PNVHistorical Med       !! - Potential duplicate medications found. Please discuss with provider.          ALLERGIES     Patient has no known allergies.    FAMILY HISTORY     No family history on file.       SOCIAL HISTORY       Social History     Socioeconomic History    Marital status: Married   Tobacco Use    Smoking status: Never     Passive exposure: Never    Smokeless tobacco: Never    Tobacco comments:     11/17/2020   Vaping Use    Vaping Use: Never used   Substance and Sexual Activity    Alcohol use: Never    Drug use: Never    Sexual activity: Yes     Partners: Male  SCREENINGS         Glasgow Coma Scale  Eye Opening: Spontaneous  Best Verbal Response: Oriented  Best Motor Response: Obeys commands  Glasgow Coma Scale Score: 15                     CIWA Assessment  BP: 112/68  Pulse: 93                 PHYSICAL EXAM    (up to 7 for level 4, 8 or more for level 5)     ED Triage Vitals   BP Temp Temp Source Pulse Respirations SpO2 Height Weight   12/17/21 2150 12/17/21 2126 12/17/21 2126 12/17/21 2126 12/17/21 2126 12/17/21 2126 -- --   108/71 97.5 F (36.4 C) Oral 98 18 99 %         Physical Exam    DIAGNOSTIC RESULTS     EKG: All EKG's are interpreted by the Emergency Department Physician who either signs or Co-signs this chart in the absence of a cardiologist.        RADIOLOGY:   Non-plain film images such as CT, Ultrasound and MRI are read by the radiologist. Plain radiographic images are visualized and preliminarily interpreted by the emergency physician with the below findings:        Interpretation per the Radiologist below, if available at the time of this note:    No orders to display         ED BEDSIDE ULTRASOUND:   Performed by ED Physician - none    LABS:  Labs Reviewed   CBC WITH AUTO DIFFERENTIAL - Abnormal;  Notable for the following components:       Result Value    WBC 14.9 (*)     Neutrophils % 87.9 (*)     Lymphocytes 8.4 (*)     Monocytes 3.2 (*)     Neutrophils Absolute 13.1 (*)     All other components within normal limits   COMPREHENSIVE METABOLIC PANEL - Abnormal; Notable for the following components:    Sodium 132 (*)     Potassium 3.3 (*)     Chloride 95 (*)     OSMOLALITY CALCULATED 263 (*)     All other components within normal limits   CULTURE, BLOOD 1   CULTURE, BLOOD 1   LACTIC ACID       All other labs were within normal range or not returned as of this dictation.    EMERGENCY DEPARTMENT COURSE and DIFFERENTIAL DIAGNOSIS/MDM:   Vitals:    Vitals:    12/17/21 2126 12/17/21 2150 12/17/21 2310 12/17/21 2343   BP:  108/71 112/68    Pulse: 98  93    Resp: 18  16    Temp: 97.5 F (36.4 C)  97.7 F (36.5 C) 97.8 F (36.6 C)   TempSrc: Oral  Oral Oral   SpO2: 99%              Medical Decision Making  Amount and/or Complexity of Data Reviewed  Labs: ordered.  ECG/medicine tests: ordered.    Risk  Prescription drug management.        FINAL IMPRESSION      1. Mastitis    2. Leukocytosis, unspecified type          DISPOSITION/PLAN   DISPOSITION Decision To Discharge 12/17/2021 11:20:55 PM      PATIENT REFERRED TO:  Return to the urgent care facilityfor re-evaluation of your  symptoms and to possibly re-check your bloodwork    In 2 days      [facility] EMERGENCY DEPT  [address]  [place] [address]  [phone]    As needed, If symptoms worsen      DISCHARGE MEDICATIONS:  Discharge Medication List as of 12/17/2021 11:24 PM        START taking these medications    Details   !! ibuprofen (ADVIL;MOTRIN) 800 MG tablet Take 1 tablet by mouth every 8 hours as needed for Pain, Disp-15 tablet, R-0Normal      amoxicillin-clavulanate (AUGMENTIN) 875-125 MG per tablet Take 1 tablet by mouth 2 times daily for 10 days, Disp-20 tablet, R-0Normal       !! - Potential duplicate medications found. Please  discuss with provider.        Controlled Substances Monitoring:     No flowsheet data found.    (Please note that portions of this note were completed with a voice recognition program.  Efforts were made to edit the dictations but occasionally words are mis-transcribed.)    Bertin Inabinet Koleen Nimrod, PA-C (electronically signed)  Attending Emergency Physician           Vanice Sarah, PA-C  12/18/21 [phone]

## 2021-12-18 ENCOUNTER — Inpatient Hospital Stay
Admit: 2021-12-18 | Discharge: 2021-12-18 | Disposition: A | Discharge status: Home / Self Care | Payer: BLUE CROSS/BLUE SHIELD | Attending: Emergency Medicine

## 2021-12-18 LAB — COMPREHENSIVE METABOLIC PANEL
ALT: 31 U/L (ref 0–35)
AST: 28 U/L (ref 0–35)
Albumin/Globulin Ratio: 1.16 (ref 1.00–2.70)
Albumin: 4.3 g/dL (ref 3.5–5.2)
Alk Phosphatase: 64 U/L (ref 35–117)
Anion Gap: 14 mmol/L (ref 2–17)
BUN: 9 mg/dL (ref 6–20)
CO2: 23 mmol/L (ref 22–29)
Calcium: 8.9 mg/dL (ref 8.6–10.0)
Chloride: 95 mmol/L — ABNORMAL LOW (ref 98–107)
Creatinine: 0.5 mg/dL (ref 0.5–1.0)
Est, Glom Filt Rate: 128 mL/min/1.73m (ref >=60)
Globulin: 3.7 g/dL (ref 1.9–4.4)
Glucose: 93 mg/dL (ref 70–99)
OSMOLALITY CALCULATED: 263 mOsm/kg — ABNORMAL LOW (ref 270–287)
Potassium: 3.3 mmol/L — ABNORMAL LOW (ref 3.5–5.3)
Sodium: 132 mmol/L — ABNORMAL LOW (ref 135–145)
Total Bilirubin: 0.6 mg/dL (ref 0.00–1.20)
Total Protein: 8 g/dL (ref 6.4–8.3)

## 2021-12-18 LAB — CBC WITH AUTO DIFFERENTIAL
Absolute Baso #: 0 10*3/uL (ref 0.0–0.2)
Absolute Baso #: 0 10*3/uL (ref 0.0–0.2)
Absolute Eos #: 0 10*3/uL (ref 0.0–0.5)
Absolute Eos #: 0 10*3/uL (ref 0.0–0.5)
Absolute Lymph #: 1.1 10*3/uL (ref 1.0–3.2)
Absolute Lymph #: 1.3 10*3/uL (ref 1.0–3.2)
Absolute Mono #: 0.6 10*3/uL (ref 0.3–1.0)
Absolute Mono #: 0.5 10*3/uL (ref 0.3–1.0)
Basophils %: 0.2 % (ref 0.0–2.0)
Basophils %: 0.2 % (ref 0.0–2.0)
Eosinophils %: 0 % (ref 0.0–7.0)
Eosinophils %: 0 % (ref 0.0–7.0)
Hematocrit: 37.8 % (ref 34.0–47.0)
Hematocrit: 38.7 % (ref 34.0–47.0)
Hemoglobin: 13.2 g/dL (ref 11.5–15.7)
Hemoglobin: 13.4 g/dL (ref 11.5–15.7)
Immature Grans (Abs): 0.05 10*3/uL (ref 0.00–0.06)
Immature Grans (Abs): 0.05 10*3/uL (ref 0.00–0.06)
Immature Granulocytes: 0.3 % (ref 0.0–0.6)
Immature Granulocytes: 0.3 % (ref 0.0–0.6)
Lymphocytes: 7.2 % — ABNORMAL LOW (ref 15.0–45.0)
Lymphocytes: 8.4 % — ABNORMAL LOW (ref 15.0–45.0)
MCH: 30.4 pg (ref 27.0–34.5)
MCH: 31.1 pg (ref 27.0–34.5)
MCHC: 34.9 g/dL (ref 32.0–36.0)
MCHC: 34.6 g/dL (ref 32.0–36.0)
MCV: 87.1 fL (ref 81.0–99.0)
MCV: 89.8 fL (ref 81.0–99.0)
MPV: 11 fL (ref 7.2–13.2)
MPV: 9.8 fL (ref 7.2–13.2)
Monocytes: 3.6 % — ABNORMAL LOW (ref 4.0–12.0)
Monocytes: 3.2 % — ABNORMAL LOW (ref 4.0–12.0)
NRBC Absolute: 0 10*3/uL (ref 0.000–0.012)
NRBC Absolute: 0 10*3/uL (ref 0.000–0.012)
NRBC Automated: 0 % (ref 0.0–0.2)
NRBC Automated: 0 % (ref 0.0–0.2)
Neutrophils %: 88.7 % — ABNORMAL HIGH (ref 42.0–74.0)
Neutrophils %: 87.9 % — ABNORMAL HIGH (ref 42.0–74.0)
Neutrophils Absolute: 13.9 10*3/uL — ABNORMAL HIGH (ref 1.6–7.3)
Neutrophils Absolute: 13.1 10*3/uL — ABNORMAL HIGH (ref 1.6–7.3)
Platelets: 165 10*3/uL (ref 140–440)
Platelets: 151 10*3/uL (ref 140–440)
RBC: 4.34 x10e6/mcL (ref 3.60–5.20)
RBC: 4.31 x10e6/mcL (ref 3.60–5.20)
RDW: 12.1 % (ref 11.0–16.0)
RDW: 12 % (ref 11.0–16.0)
WBC: 15.7 10*3/uL — ABNORMAL HIGH (ref 3.8–10.6)
WBC: 14.9 10*3/uL — ABNORMAL HIGH (ref 3.8–10.6)

## 2021-12-18 LAB — LACTIC ACID: Lactic Acid: 1 mmol/L (ref 0.5–2.0)

## 2021-12-18 LAB — CULTURE, URINE: FINAL REPORT: 3000

## 2021-12-18 MED ORDER — SODIUM CHLORIDE 0.9 % IV BOLUS
0.9 % | Freq: Once | INTRAVENOUS | Status: AC
Start: 2021-12-18 — End: 2021-12-17
  Administered 2021-12-18: 02:00:00 1000 mL via INTRAVENOUS

## 2021-12-18 MED ORDER — SODIUM CHLORIDE 0.9 % IV SOLN (MINI-BAG)
0.9 % | Freq: Once | INTRAVENOUS | Status: AC
Start: 2021-12-18 — End: 2021-12-17
  Administered 2021-12-18: 03:00:00 3000 mg via INTRAVENOUS

## 2021-12-18 MED ORDER — AMOXICILLIN-POT CLAVULANATE 875-125 MG PO TABS
875-125 MG | ORAL_TABLET | Freq: Two times a day (BID) | ORAL | 0 refills | Status: AC
Start: 2021-12-18 — End: 2021-12-27

## 2021-12-18 MED ORDER — IBUPROFEN 800 MG PO TABS
800 MG | ORAL_TABLET | Freq: Three times a day (TID) | ORAL | 0 refills | Status: AC | PRN
Start: 2021-12-18 — End: 2021-12-22

## 2021-12-18 MED ORDER — POTASSIUM CHLORIDE CRYS ER 20 MEQ PO TBCR
20 MEQ | Freq: Once | ORAL | Status: AC
Start: 2021-12-18 — End: 2021-12-17
  Administered 2021-12-18: 04:00:00 20 meq via ORAL

## 2021-12-18 MED FILL — AMPICILLIN-SULBACTAM SODIUM 3 (2-1) G IJ SOLR: 3 (2-1) g | INTRAMUSCULAR | Qty: 3000

## 2021-12-18 MED FILL — KLOR-CON M20 20 MEQ PO TBCR: 20 MEQ | ORAL | Qty: 1

## 2021-12-19 LAB — CULTURE, BLOOD 1

## 2021-12-20 LAB — EKG 12-LEAD
P Axis: 48 degrees
P-R Interval: 168 ms
Q-T Interval: 376 ms
QRS Duration: 80 ms
QTc Calculation (Bazett): 412 ms
R Axis: 62 degrees
T Axis: 44 degrees
Ventricular Rate: 80 {beats}/min

## 2021-12-23 LAB — CULTURE, BLOOD 1

## 2023-11-02 ENCOUNTER — Ambulatory Visit: Admit: 2023-11-02 | Discharge: 2023-11-02 | Payer: BLUE CROSS/BLUE SHIELD | Attending: Physician Assistant

## 2023-11-02 VITALS — BP 100/72 | HR 66 | Temp 98.00000°F | Resp 16 | Ht 64.0 in | Wt 163.1 lb

## 2023-11-02 DIAGNOSIS — R5383 Other fatigue: Secondary | ICD-10-CM

## 2023-11-02 LAB — AMB POC URINE PREGNANCY TEST, VISUAL COLOR COMPARISON: HCG, Pregnancy, Urine, POC: NEGATIVE

## 2023-11-02 LAB — AMB POC URINALYSIS DIP STICK AUTO W/O MICRO
Bilirubin, Urine, POC: NEGATIVE
Blood (UA POC): NEGATIVE
Glucose, Urine, POC: NEGATIVE
Ketones, Urine, POC: NEGATIVE
Leukocyte Esterase, Urine, POC: NEGATIVE
Nitrite, Urine, POC: NEGATIVE
Protein, Urine, POC: NEGATIVE
Specific Gravity, Urine, POC: 1.01 (ref 1.001–1.035)
Urobilinogen, POC: 0.2 mg/dL (ref <1.1)
pH, Urine, POC: 6 (ref 4.6–8.0)

## 2023-11-02 NOTE — Progress Notes (Addendum)
 Chief Complaint Fatigue (Patient complains of feeling fatigue, and weak when she is on and off her period. Symptoms are getting with periods. Used interpreter ID's: Dana [id number] and Yasmin #[id number].)       Time Patient seen by Provider:6:01 PM     11/02/23     Jaylina Nait Ssi Lahcen , female , 33 y.o.     33 yo female presents with her spouse. C/o feeling tired and fatigue with her menstrual cycles for the past 3 months. She does not have PCP. Denies cough, fever, chest pain or SOB. She denies heavy, irregular or longer menstrual cycles      Language interpreter used: Yes         Current Outpatient Medications   Medication Sig Dispense Refill    ibuprofen  (ADVIL ;MOTRIN ) 800 MG tablet Take 1 tablet by mouth every 8 hours as needed for Pain (Patient not taking: Reported on 11/02/2023) 15 tablet 0    ibuprofen  (ADVIL ;MOTRIN ) 800 MG tablet Take 1 tablet by mouth every 8 hours as needed for Pain (Patient not taking: Reported on 11/02/2023) 30 tablet 0    Prenatal Vit w/Fe-Methylfol-FA (PNV PO) PNV (Patient not taking: Reported on 11/02/2023)       No current facility-administered medications for this visit.        History reviewed. No pertinent past medical history.     History reviewed. No pertinent surgical history.     History reviewed. No pertinent family history.     Social History     Socioeconomic History    Marital status: Married     Spouse name: Not on file    Number of children: Not on file    Years of education: Not on file    Highest education level: Not on file   Occupational History    Not on file   Tobacco Use    Smoking status: Never     Passive exposure: Never    Smokeless tobacco: Never    Tobacco comments:     11/17/2020   Vaping Use    Vaping status: Never Used   Substance and Sexual Activity    Alcohol use: Never    Drug use: Never    Sexual activity: Yes     Partners: Male   Other Topics Concern    Not on file   Social History Narrative    Not on file     Social Drivers of Health     Financial Resource  Strain: Not on file   Food Insecurity: Not on file   Transportation Needs: Not on file   Physical Activity: Not on file   Stress: Not on file   Social Connections: Not on file   Intimate Partner Violence: Not on file   Housing Stability: Not on file        No Known Allergies     Vitals:    11/02/23 1617   BP: 100/72   Pulse: 66   Resp: 16   Temp: 98 F (36.7 C)   SpO2: 98%        Physical Exam  Constitutional:       Appearance: Normal appearance.   HENT:      Head: Normocephalic and atraumatic.      Nose: Nose normal.      Mouth/Throat:      Mouth: Mucous membranes are moist.   Cardiovascular:      Rate and Rhythm: Normal rate and regular rhythm.   Pulmonary:  Effort: Pulmonary effort is normal.      Breath sounds: Normal breath sounds.   Skin:     General: Skin is warm and dry.   Neurological:      General: No focal deficit present.      Mental Status: She is alert.   Psychiatric:         Mood and Affect: Mood normal.         Behavior: Behavior normal.         Thought Content: Thought content normal.          Results/Reports  Results for orders placed or performed in visit on 11/02/23   AMB POC URINALYSIS DIP STICK AUTO W/O MICRO   Result Value Ref Range    Color (UA POC) Yellow     Clarity (UA POC) Clear     Glucose, Urine, POC Negative     Bilirubin, Urine, POC Negative     Ketones, Urine, POC Negative     Specific Gravity, Urine, POC 1.010 1.001 - 1.035    Blood (UA POC) Negative     pH, Urine, POC 6.0 4.6 - 8.0    Protein, Urine, POC Negative     Urobilinogen, POC 0.2 mg/dL <1.6 mg/dL    Nitrite, Urine, POC Negative Negative    Leukocyte Esterase, Urine, POC Negative    POC Urine Pregnancy Test (10960)   Result Value Ref Range    Valid Internal Control, POC PASS     HCG, Pregnancy, Urine, POC Negative           Assessment/Plan      Kizzy was seen today for fatigue.    Diagnoses and all orders for this visit:    Fatigue, unspecified type  -     Comprehensive Metabolic Panel  -     CBC with Auto  Differential  -     TSH reflex to FT4 (CERNER)  -     AMB POC URINALYSIS DIP STICK AUTO W/O MICRO  -     POC Urine Pregnancy Test (45409)    Will call with lab results     I have reviewed prior visit notes and lab results pertinent to this visit. Point of care results and imaging were independently interpreted by myself and discussed with the patient.     Patient instructed to follow-up for any new or worsening symptoms. Patient verbalized understanding and agreement with plan of care.       Electronically signed by    Haze List, PA-C

## 2023-11-03 LAB — CBC WITH AUTO DIFFERENTIAL
Basophils %: 0.4 % (ref 0.0–2.0)
Basophils Absolute: 0 x10e3/mcL (ref 0.0–0.2)
Eosinophils %: 1.1 % (ref 0.0–7.0)
Eosinophils Absolute: 0.1 x10e3/mcL (ref 0.0–0.5)
Hematocrit: 34.6 % (ref 34.0–47.0)
Hemoglobin: 11.3 g/dL — ABNORMAL LOW (ref 11.5–15.7)
Immature Grans (Abs): 0.03 x10e3/mcL (ref 0.00–0.06)
Immature Granulocytes %: 0.4 % (ref 0.0–0.6)
Lymphocytes Absolute: 2.1 x10e3/mcL (ref 1.0–3.2)
Lymphocytes: 25.1 % (ref 15.0–45.0)
MCH: 27.4 pg (ref 27.0–34.5)
MCHC: 32.7 g/dL (ref 30.0–36.0)
MCV: 84 fL (ref 81.0–99.0)
MPV: 11.3 fL (ref 7.0–12.2)
Monocytes %: 4.7 % (ref 4.0–12.0)
Monocytes Absolute: 0.4 x10e3/mcL (ref 0.3–1.0)
NRBC Absolute: 0 x10e3/mcL (ref 0.000–0.012)
NRBC Automated: 0 % (ref 0.0–0.2)
Neutrophils %: 68.3 % (ref 42.0–74.0)
Neutrophils Absolute: 5.6 x10e3/mcL (ref 1.6–7.3)
Platelets: 204 x10e3/mcL (ref 140–440)
RBC: 4.12 x10e6/mcL (ref 3.60–5.20)
RDW: 13.1 % (ref 10.0–17.0)
WBC: 8.2 x10e3/mcL (ref 3.8–10.6)

## 2023-11-03 LAB — COMPREHENSIVE METABOLIC PANEL
ALT: 13 U/L (ref 0–42)
AST: 18 U/L (ref 0–46)
Albumin/Globulin Ratio: 1.7 (ref 1.00–2.70)
Albumin: 4.6 g/dL (ref 3.5–5.2)
Alk Phosphatase: 54 U/L (ref 35–117)
Anion Gap: 11 mmol/L (ref 2–17)
BUN: 8 mg/dL (ref 6–20)
CO2: 25 mmol/L (ref 22–29)
Calcium: 9.1 mg/dL (ref 8.5–10.7)
Chloride: 102 mmol/L (ref 98–107)
Creatinine: 0.5 mg/dL (ref 0.5–1.0)
Est, Glom Filt Rate: 126 mL/min/1.73m (ref >=60)
Globulin: 2.7 g/dL (ref 1.9–4.4)
Glucose: 83 mg/dL (ref 70–99)
Osmolaliy Calculated: 273 mosm/kg (ref 270–287)
Potassium: 3.8 mmol/L (ref 3.5–5.3)
Sodium: 138 mmol/L (ref 135–145)
Total Bilirubin: 0.17 mg/dL (ref 0.00–1.20)
Total Protein: 7.3 g/dL (ref 5.7–8.3)

## 2023-11-03 LAB — TSH REFLEX TO FT4: TSH: 1.27 u[IU]/mL (ref 0.358–3.740)

## 2023-11-03 NOTE — Other (Signed)
 Husband answered advised he has received results already. No further questions at this time.

## 2023-11-03 NOTE — Telephone Encounter (Signed)
 Spoke with husband, no further questions at this time.

## 2023-11-03 NOTE — Other (Signed)
 RESULTS GIVEN

## 2024-08-01 ENCOUNTER — Encounter: Payer: BLUE CROSS/BLUE SHIELD | Attending: Student in an Organized Health Care Education/Training Program
